# Patient Record
Sex: Female | Born: 1960 | Race: Black or African American | Hispanic: No | Smoking: Current every day smoker
Health system: Southern US, Community
[De-identification: ages and names within clinical notes are randomized; demographics above are authoritative.]

## PROBLEM LIST (undated history)

## (undated) DIAGNOSIS — K219 Gastro-esophageal reflux disease without esophagitis: Secondary | ICD-10-CM

## (undated) DIAGNOSIS — R569 Unspecified convulsions: Secondary | ICD-10-CM

## (undated) DIAGNOSIS — E7800 Pure hypercholesterolemia, unspecified: Secondary | ICD-10-CM

## (undated) DIAGNOSIS — I10 Essential (primary) hypertension: Secondary | ICD-10-CM

## (undated) DIAGNOSIS — I639 Cerebral infarction, unspecified: Secondary | ICD-10-CM

## (undated) DIAGNOSIS — J302 Other seasonal allergic rhinitis: Secondary | ICD-10-CM

## (undated) DIAGNOSIS — G629 Polyneuropathy, unspecified: Secondary | ICD-10-CM

## (undated) HISTORY — DX: Other seasonal allergic rhinitis: J30.2

---

## 1998-04-19 ENCOUNTER — Emergency Department (HOSPITAL_COMMUNITY): Admission: EM | Admit: 1998-04-19 | Discharge: 1998-04-19 | Payer: Self-pay | Admitting: Emergency Medicine

## 1998-04-22 ENCOUNTER — Emergency Department (HOSPITAL_COMMUNITY): Admission: EM | Admit: 1998-04-22 | Discharge: 1998-04-22 | Payer: Self-pay | Admitting: Emergency Medicine

## 1999-01-02 ENCOUNTER — Emergency Department (HOSPITAL_COMMUNITY): Admission: EM | Admit: 1999-01-02 | Discharge: 1999-01-02 | Payer: Self-pay | Admitting: Emergency Medicine

## 1999-01-02 ENCOUNTER — Encounter: Payer: Self-pay | Admitting: Emergency Medicine

## 1999-02-19 ENCOUNTER — Ambulatory Visit (HOSPITAL_COMMUNITY): Admission: RE | Admit: 1999-02-19 | Discharge: 1999-02-19 | Payer: Self-pay | Admitting: *Deleted

## 1999-02-19 ENCOUNTER — Encounter: Payer: Self-pay | Admitting: *Deleted

## 1999-04-09 ENCOUNTER — Ambulatory Visit (HOSPITAL_COMMUNITY): Admission: RE | Admit: 1999-04-09 | Discharge: 1999-04-09 | Payer: Self-pay | Admitting: Orthopedic Surgery

## 1999-04-09 ENCOUNTER — Encounter: Payer: Self-pay | Admitting: Orthopedic Surgery

## 1999-07-05 ENCOUNTER — Observation Stay (HOSPITAL_COMMUNITY): Admission: RE | Admit: 1999-07-05 | Discharge: 1999-07-06 | Payer: Self-pay | Admitting: *Deleted

## 1999-07-05 ENCOUNTER — Encounter (INDEPENDENT_AMBULATORY_CARE_PROVIDER_SITE_OTHER): Payer: Self-pay | Admitting: Specialist

## 1999-07-05 ENCOUNTER — Encounter: Payer: Self-pay | Admitting: Anesthesiology

## 1999-07-26 ENCOUNTER — Encounter: Payer: Self-pay | Admitting: Emergency Medicine

## 1999-07-26 ENCOUNTER — Emergency Department (HOSPITAL_COMMUNITY): Admission: EM | Admit: 1999-07-26 | Discharge: 1999-07-26 | Payer: Self-pay | Admitting: Emergency Medicine

## 1999-09-21 ENCOUNTER — Encounter: Payer: Self-pay | Admitting: *Deleted

## 1999-09-21 ENCOUNTER — Inpatient Hospital Stay (HOSPITAL_COMMUNITY): Admission: AD | Admit: 1999-09-21 | Discharge: 1999-09-21 | Payer: Self-pay | Admitting: *Deleted

## 1999-12-17 ENCOUNTER — Other Ambulatory Visit: Admission: RE | Admit: 1999-12-17 | Discharge: 1999-12-17 | Payer: Self-pay | Admitting: *Deleted

## 2000-04-14 ENCOUNTER — Inpatient Hospital Stay (HOSPITAL_COMMUNITY): Admission: AD | Admit: 2000-04-14 | Discharge: 2000-04-14 | Payer: Self-pay | Admitting: Obstetrics and Gynecology

## 2000-06-02 ENCOUNTER — Encounter: Payer: Self-pay | Admitting: Emergency Medicine

## 2000-06-02 ENCOUNTER — Emergency Department (HOSPITAL_COMMUNITY): Admission: EM | Admit: 2000-06-02 | Discharge: 2000-06-02 | Payer: Self-pay | Admitting: Emergency Medicine

## 2000-08-11 ENCOUNTER — Inpatient Hospital Stay (HOSPITAL_COMMUNITY): Admission: AD | Admit: 2000-08-11 | Discharge: 2000-08-11 | Payer: Self-pay | Admitting: Obstetrics and Gynecology

## 2000-09-29 ENCOUNTER — Ambulatory Visit (HOSPITAL_COMMUNITY): Admission: RE | Admit: 2000-09-29 | Discharge: 2000-09-29 | Payer: Self-pay | Admitting: *Deleted

## 2000-09-29 ENCOUNTER — Encounter (INDEPENDENT_AMBULATORY_CARE_PROVIDER_SITE_OTHER): Payer: Self-pay

## 2001-04-30 ENCOUNTER — Encounter: Payer: Self-pay | Admitting: *Deleted

## 2001-04-30 ENCOUNTER — Encounter: Admission: RE | Admit: 2001-04-30 | Discharge: 2001-04-30 | Payer: Self-pay | Admitting: *Deleted

## 2001-04-30 ENCOUNTER — Inpatient Hospital Stay (HOSPITAL_COMMUNITY): Admission: AD | Admit: 2001-04-30 | Discharge: 2001-04-30 | Payer: Self-pay | Admitting: Obstetrics & Gynecology

## 2001-08-07 ENCOUNTER — Inpatient Hospital Stay (HOSPITAL_COMMUNITY): Admission: AD | Admit: 2001-08-07 | Discharge: 2001-08-07 | Payer: Self-pay | Admitting: *Deleted

## 2001-09-29 ENCOUNTER — Ambulatory Visit (HOSPITAL_COMMUNITY): Admission: RE | Admit: 2001-09-29 | Discharge: 2001-09-29 | Payer: Self-pay | Admitting: Internal Medicine

## 2003-03-23 ENCOUNTER — Encounter: Payer: Self-pay | Admitting: Emergency Medicine

## 2003-03-23 ENCOUNTER — Emergency Department (HOSPITAL_COMMUNITY): Admission: EM | Admit: 2003-03-23 | Discharge: 2003-03-23 | Payer: Self-pay | Admitting: Emergency Medicine

## 2003-08-31 ENCOUNTER — Emergency Department (HOSPITAL_COMMUNITY): Admission: EM | Admit: 2003-08-31 | Discharge: 2003-08-31 | Payer: Self-pay | Admitting: Emergency Medicine

## 2003-08-31 ENCOUNTER — Encounter: Payer: Self-pay | Admitting: Emergency Medicine

## 2004-01-24 ENCOUNTER — Emergency Department (HOSPITAL_COMMUNITY): Admission: EM | Admit: 2004-01-24 | Discharge: 2004-01-24 | Payer: Self-pay | Admitting: Emergency Medicine

## 2004-01-26 ENCOUNTER — Emergency Department (HOSPITAL_COMMUNITY): Admission: EM | Admit: 2004-01-26 | Discharge: 2004-01-26 | Payer: Self-pay | Admitting: Emergency Medicine

## 2004-09-08 ENCOUNTER — Emergency Department (HOSPITAL_COMMUNITY): Admission: EM | Admit: 2004-09-08 | Discharge: 2004-09-08 | Payer: Self-pay | Admitting: Emergency Medicine

## 2005-01-07 ENCOUNTER — Encounter: Admission: RE | Admit: 2005-01-07 | Discharge: 2005-04-07 | Payer: Self-pay | Admitting: Orthopaedic Surgery

## 2005-07-09 ENCOUNTER — Emergency Department (HOSPITAL_COMMUNITY): Admission: EM | Admit: 2005-07-09 | Discharge: 2005-07-09 | Payer: Self-pay | Admitting: Emergency Medicine

## 2006-03-26 ENCOUNTER — Inpatient Hospital Stay (HOSPITAL_COMMUNITY): Admission: AD | Admit: 2006-03-26 | Discharge: 2006-03-26 | Payer: Self-pay | Admitting: Gynecology

## 2006-09-05 ENCOUNTER — Ambulatory Visit: Payer: Self-pay | Admitting: Family Medicine

## 2006-09-08 ENCOUNTER — Ambulatory Visit: Payer: Self-pay | Admitting: *Deleted

## 2006-09-11 DIAGNOSIS — Z8669 Personal history of other diseases of the nervous system and sense organs: Secondary | ICD-10-CM | POA: Insufficient documentation

## 2006-09-14 ENCOUNTER — Emergency Department (HOSPITAL_COMMUNITY): Admission: EM | Admit: 2006-09-14 | Discharge: 2006-09-14 | Payer: Self-pay | Admitting: Emergency Medicine

## 2006-10-29 ENCOUNTER — Ambulatory Visit: Payer: Self-pay | Admitting: Family Medicine

## 2006-10-31 ENCOUNTER — Encounter (INDEPENDENT_AMBULATORY_CARE_PROVIDER_SITE_OTHER): Payer: Self-pay | Admitting: Internal Medicine

## 2006-10-31 ENCOUNTER — Ambulatory Visit: Payer: Self-pay | Admitting: Internal Medicine

## 2006-10-31 LAB — CONVERTED CEMR LAB: Pap Smear: NORMAL

## 2006-11-13 ENCOUNTER — Ambulatory Visit (HOSPITAL_COMMUNITY): Admission: RE | Admit: 2006-11-13 | Discharge: 2006-11-13 | Payer: Self-pay | Admitting: Family Medicine

## 2006-11-25 ENCOUNTER — Ambulatory Visit: Payer: Self-pay | Admitting: Internal Medicine

## 2006-11-25 DIAGNOSIS — R1084 Generalized abdominal pain: Secondary | ICD-10-CM | POA: Insufficient documentation

## 2006-12-12 ENCOUNTER — Ambulatory Visit: Payer: Self-pay | Admitting: Internal Medicine

## 2006-12-17 ENCOUNTER — Ambulatory Visit (HOSPITAL_COMMUNITY): Admission: RE | Admit: 2006-12-17 | Discharge: 2006-12-17 | Payer: Self-pay | Admitting: Internal Medicine

## 2006-12-23 ENCOUNTER — Ambulatory Visit: Payer: Self-pay | Admitting: Internal Medicine

## 2006-12-30 ENCOUNTER — Emergency Department (HOSPITAL_COMMUNITY): Admission: EM | Admit: 2006-12-30 | Discharge: 2006-12-31 | Payer: Self-pay | Admitting: Emergency Medicine

## 2007-01-02 ENCOUNTER — Ambulatory Visit: Payer: Self-pay | Admitting: Internal Medicine

## 2007-01-11 ENCOUNTER — Emergency Department (HOSPITAL_COMMUNITY): Admission: EM | Admit: 2007-01-11 | Discharge: 2007-01-11 | Payer: Self-pay | Admitting: Emergency Medicine

## 2007-05-28 ENCOUNTER — Ambulatory Visit: Payer: Self-pay | Admitting: Internal Medicine

## 2007-05-28 DIAGNOSIS — R030 Elevated blood-pressure reading, without diagnosis of hypertension: Secondary | ICD-10-CM | POA: Insufficient documentation

## 2007-07-22 ENCOUNTER — Telehealth (INDEPENDENT_AMBULATORY_CARE_PROVIDER_SITE_OTHER): Payer: Self-pay | Admitting: Internal Medicine

## 2007-07-23 ENCOUNTER — Encounter (INDEPENDENT_AMBULATORY_CARE_PROVIDER_SITE_OTHER): Payer: Self-pay | Admitting: Internal Medicine

## 2007-07-23 DIAGNOSIS — K219 Gastro-esophageal reflux disease without esophagitis: Secondary | ICD-10-CM | POA: Insufficient documentation

## 2007-07-23 LAB — CONVERTED CEMR LAB: Creatinine, Ser: 0.69 mg/dL

## 2007-07-24 ENCOUNTER — Ambulatory Visit (HOSPITAL_COMMUNITY): Admission: RE | Admit: 2007-07-24 | Discharge: 2007-07-24 | Payer: Self-pay | Admitting: Internal Medicine

## 2007-07-24 ENCOUNTER — Ambulatory Visit: Payer: Self-pay | Admitting: Internal Medicine

## 2007-07-24 DIAGNOSIS — M67919 Unspecified disorder of synovium and tendon, unspecified shoulder: Secondary | ICD-10-CM | POA: Insufficient documentation

## 2007-07-24 DIAGNOSIS — M719 Bursopathy, unspecified: Secondary | ICD-10-CM

## 2007-07-26 ENCOUNTER — Emergency Department (HOSPITAL_COMMUNITY): Admission: EM | Admit: 2007-07-26 | Discharge: 2007-07-26 | Payer: Self-pay | Admitting: Emergency Medicine

## 2007-07-29 ENCOUNTER — Encounter (INDEPENDENT_AMBULATORY_CARE_PROVIDER_SITE_OTHER): Payer: Self-pay | Admitting: *Deleted

## 2007-08-07 ENCOUNTER — Telehealth (INDEPENDENT_AMBULATORY_CARE_PROVIDER_SITE_OTHER): Payer: Self-pay | Admitting: *Deleted

## 2007-10-01 ENCOUNTER — Telehealth (INDEPENDENT_AMBULATORY_CARE_PROVIDER_SITE_OTHER): Payer: Self-pay | Admitting: Internal Medicine

## 2007-10-22 ENCOUNTER — Ambulatory Visit: Payer: Self-pay | Admitting: Internal Medicine

## 2007-10-22 LAB — CONVERTED CEMR LAB
Ketones, urine, test strip: NEGATIVE
Nitrite: POSITIVE
WBC Urine, dipstick: NEGATIVE

## 2007-10-23 ENCOUNTER — Encounter (INDEPENDENT_AMBULATORY_CARE_PROVIDER_SITE_OTHER): Payer: Self-pay | Admitting: Internal Medicine

## 2007-10-26 ENCOUNTER — Telehealth (INDEPENDENT_AMBULATORY_CARE_PROVIDER_SITE_OTHER): Payer: Self-pay | Admitting: Internal Medicine

## 2007-11-24 ENCOUNTER — Encounter: Admission: RE | Admit: 2007-11-24 | Discharge: 2008-01-14 | Payer: Self-pay | Admitting: Internal Medicine

## 2007-11-24 ENCOUNTER — Encounter (INDEPENDENT_AMBULATORY_CARE_PROVIDER_SITE_OTHER): Payer: Self-pay | Admitting: Internal Medicine

## 2007-11-26 ENCOUNTER — Ambulatory Visit: Payer: Self-pay | Admitting: Internal Medicine

## 2007-11-26 DIAGNOSIS — N390 Urinary tract infection, site not specified: Secondary | ICD-10-CM | POA: Insufficient documentation

## 2007-11-26 LAB — CONVERTED CEMR LAB
Ketones, urine, test strip: NEGATIVE
Nitrite: POSITIVE
Specific Gravity, Urine: >=1.03

## 2007-11-28 ENCOUNTER — Encounter (INDEPENDENT_AMBULATORY_CARE_PROVIDER_SITE_OTHER): Payer: Self-pay | Admitting: Internal Medicine

## 2007-12-04 ENCOUNTER — Ambulatory Visit: Payer: Self-pay | Admitting: Nurse Practitioner

## 2007-12-04 LAB — CONVERTED CEMR LAB
Bilirubin Urine: NEGATIVE
Specific Gravity, Urine: >=1.03
Urobilinogen, UA: NEGATIVE
WBC Urine, dipstick: NEGATIVE
pH: 5

## 2007-12-24 ENCOUNTER — Ambulatory Visit: Payer: Self-pay | Admitting: Internal Medicine

## 2007-12-24 DIAGNOSIS — R109 Unspecified abdominal pain: Secondary | ICD-10-CM | POA: Insufficient documentation

## 2007-12-24 DIAGNOSIS — K0501 Acute gingivitis, non-plaque induced: Secondary | ICD-10-CM | POA: Insufficient documentation

## 2007-12-24 LAB — CONVERTED CEMR LAB
Bilirubin Urine: NEGATIVE
Glucose, Urine, Semiquant: NEGATIVE
Ketones, urine, test strip: NEGATIVE
Specific Gravity, Urine: <1.005

## 2007-12-25 ENCOUNTER — Encounter (INDEPENDENT_AMBULATORY_CARE_PROVIDER_SITE_OTHER): Payer: Self-pay | Admitting: Internal Medicine

## 2008-01-21 ENCOUNTER — Encounter (INDEPENDENT_AMBULATORY_CARE_PROVIDER_SITE_OTHER): Payer: Self-pay | Admitting: Internal Medicine

## 2008-02-23 ENCOUNTER — Ambulatory Visit: Payer: Self-pay | Admitting: Internal Medicine

## 2008-02-23 DIAGNOSIS — K089 Disorder of teeth and supporting structures, unspecified: Secondary | ICD-10-CM | POA: Insufficient documentation

## 2008-05-17 ENCOUNTER — Telehealth (INDEPENDENT_AMBULATORY_CARE_PROVIDER_SITE_OTHER): Payer: Self-pay | Admitting: Internal Medicine

## 2008-05-20 ENCOUNTER — Telehealth (INDEPENDENT_AMBULATORY_CARE_PROVIDER_SITE_OTHER): Payer: Self-pay | Admitting: Internal Medicine

## 2008-05-20 ENCOUNTER — Encounter (INDEPENDENT_AMBULATORY_CARE_PROVIDER_SITE_OTHER): Payer: Self-pay | Admitting: Internal Medicine

## 2008-05-31 ENCOUNTER — Ambulatory Visit: Payer: Self-pay | Admitting: Internal Medicine

## 2008-05-31 DIAGNOSIS — F4321 Adjustment disorder with depressed mood: Secondary | ICD-10-CM | POA: Insufficient documentation

## 2008-06-08 ENCOUNTER — Encounter (INDEPENDENT_AMBULATORY_CARE_PROVIDER_SITE_OTHER): Payer: Self-pay | Admitting: Internal Medicine

## 2008-08-09 ENCOUNTER — Telehealth (INDEPENDENT_AMBULATORY_CARE_PROVIDER_SITE_OTHER): Payer: Self-pay | Admitting: Internal Medicine

## 2008-08-25 ENCOUNTER — Encounter (INDEPENDENT_AMBULATORY_CARE_PROVIDER_SITE_OTHER): Payer: Self-pay | Admitting: *Deleted

## 2008-10-19 ENCOUNTER — Telehealth (INDEPENDENT_AMBULATORY_CARE_PROVIDER_SITE_OTHER): Payer: Self-pay | Admitting: Internal Medicine

## 2008-10-21 ENCOUNTER — Ambulatory Visit: Payer: Self-pay | Admitting: Internal Medicine

## 2008-10-21 DIAGNOSIS — B3731 Acute candidiasis of vulva and vagina: Secondary | ICD-10-CM | POA: Insufficient documentation

## 2008-10-21 DIAGNOSIS — B373 Candidiasis of vulva and vagina: Secondary | ICD-10-CM | POA: Insufficient documentation

## 2008-10-21 LAB — CONVERTED CEMR LAB: KOH Prep: NEGATIVE

## 2008-12-08 ENCOUNTER — Ambulatory Visit: Payer: Self-pay | Admitting: Internal Medicine

## 2008-12-08 DIAGNOSIS — F17200 Nicotine dependence, unspecified, uncomplicated: Secondary | ICD-10-CM | POA: Insufficient documentation

## 2008-12-08 LAB — CONVERTED CEMR LAB
Nitrite: POSITIVE
Protein, U semiquant: 30
pH: 5.5

## 2008-12-09 ENCOUNTER — Encounter (INDEPENDENT_AMBULATORY_CARE_PROVIDER_SITE_OTHER): Payer: Self-pay | Admitting: Internal Medicine

## 2009-01-20 ENCOUNTER — Ambulatory Visit: Payer: Self-pay | Admitting: Internal Medicine

## 2009-01-20 DIAGNOSIS — J069 Acute upper respiratory infection, unspecified: Secondary | ICD-10-CM | POA: Insufficient documentation

## 2009-01-20 LAB — CONVERTED CEMR LAB
Ketones, urine, test strip: NEGATIVE
Nitrite: NEGATIVE
Specific Gravity, Urine: 1.015
pH: 8

## 2009-03-14 ENCOUNTER — Encounter (INDEPENDENT_AMBULATORY_CARE_PROVIDER_SITE_OTHER): Payer: Self-pay | Admitting: Internal Medicine

## 2009-03-14 ENCOUNTER — Ambulatory Visit: Payer: Self-pay | Admitting: Internal Medicine

## 2009-03-14 LAB — CONVERTED CEMR LAB
Albumin: 4.2 g/dL (ref 3.5–5.2)
Alkaline Phosphatase: 73 units/L (ref 39–117)
Basophils Relative: 0 % (ref 0–1)
Chlamydia, DNA Probe: NEGATIVE
Chloride: 106 meq/L (ref 96–112)
Cholesterol: 200 mg/dL (ref 0–200)
Eosinophils Relative: 0 % (ref 0–5)
Glucose, Urine, Semiquant: NEGATIVE
HCT: 39.6 % (ref 36.0–46.0)
LDL Cholesterol: 121 mg/dL — ABNORMAL HIGH (ref 0–99)
Lymphs Abs: 3.8 10*3/uL (ref 0.7–4.0)
MCHC: 32.1 g/dL (ref 30.0–36.0)
MCV: 93.2 fL (ref 78.0–100.0)
Monocytes Relative: 10 % (ref 3–12)
Nitrite: NEGATIVE
Platelets: 251 10*3/uL (ref 150–400)
RBC: 4.25 M/uL (ref 3.87–5.11)
RDW: 12.8 % (ref 11.5–15.5)
Specific Gravity, Urine: 1.025
Total Bilirubin: 0.7 mg/dL (ref 0.3–1.2)
Total CHOL/HDL Ratio: 2.9
Total Protein: 7.8 g/dL (ref 6.0–8.3)
Triglycerides: 54 mg/dL (ref <150)
WBC: 8.7 10*3/uL (ref 4.0–10.5)

## 2009-03-20 ENCOUNTER — Encounter (INDEPENDENT_AMBULATORY_CARE_PROVIDER_SITE_OTHER): Payer: Self-pay | Admitting: Internal Medicine

## 2009-04-04 ENCOUNTER — Ambulatory Visit (HOSPITAL_COMMUNITY): Admission: RE | Admit: 2009-04-04 | Discharge: 2009-04-04 | Payer: Self-pay | Admitting: Internal Medicine

## 2009-05-30 ENCOUNTER — Ambulatory Visit: Payer: Self-pay | Admitting: Internal Medicine

## 2011-03-29 NOTE — Op Note (Signed)
[facility]  Patient:    Sheryl Brown, Sheryl Brown                         MRN: [id number] Proc. Date: 09/29/00 Adm. Date:  [id number] Attending:  Pleas Koch                           Operative Report  PREOPERATIVE DIAGNOSIS:  Chronic pelvic pain and right lower quadrant pain.  POSTOPERATIVE DIAGNOSIS:  Possible nodular right uterosacral ligament. Right pelvic sidewall retraction pocket, with omental adhesions to anterior abdominal wall at the umbilical site.  OPERATION PERFORMED:  Laparoscopic lysis of adhesions.  Biopsy of right uterosacral ligament and fulguration of uterosacral ligament.  SURGEON:  Georgina Peer, M.D.  ANESTHESIA:  General.  BLOOD LOSS:  Less than 25 cc.  COMPLICATIONS:  None.  FINDINGS:  Possible implant of the right uterosacral ligament, with a retraction pocket on the right sidewall.  Subumbilical anterior abdominal wall adhesions of omentum.  INDICATIONS:   A 50 year old black female with right lower quadrant pain, starting in late May 2001.  The patient has had a negative ultrasound, did not respond to antibiotics, had a negative GC/Chlamydia cultures since this pain began.  However, does have a positive history of previous infection.  The patient is brought in for diagnostic and possibly therapeutic measures.  Her last menstrual period was September 13, 2000.  DESCRIPTION OF PROCEDURE:  The patient was taken to the operating room, given general anesthetic and placed in the dorsolithotomy position.  Placed in [place] stirrups; the vagina, subumbilical and abdomen was prepped and draped in normal sterile fashion.  The bladder was emptied with a catheter.  A uterine manipulator was placed through the cervix.  A subumbilical and vertical incision was made, then 3 L of carbon dioxide gas was insufflated under low pressures.  A separate trocar sleeve introduced under direct vision; 5 mm suprapubic and 5 mm left lower quadrant  ports were placed.  The above pelvic findings were noted.  There appeared to be no injury from laparoscopic trocar  placement.  There was no evidence of fibroids.  The right ovary, tube and appendix were surgically absent.  There was evidence of clips and staples.  There were no adhesions to these.  There were adhesions to the anterior abdominal wall at the umbilical site.  Using a 5 mm scope these were taken down with the tripolar forceps, and any bleeders on the omentum were cauterized.  The right uterosacral ligament had some reddish implants (possibly endometriosis), and a retraction pocket just lateral to these implants. Biopsy of the ligament, including these red implants, were taken and cauterized with bipolar cautery.  The pelvis was irrigated with a [place] irrigator.  The instruments were removed.  Sponge, needle and instrument counts were correct.  The patient was returned to the recovery area in good condition. DD:  09/29/00 TD:  09/30/00 Job: [id number]

## 2011-11-12 DIAGNOSIS — G039 Meningitis, unspecified: Secondary | ICD-10-CM

## 2011-11-12 HISTORY — DX: Meningitis, unspecified: G03.9

## 2016-12-12 DIAGNOSIS — M545 Low back pain, unspecified: Secondary | ICD-10-CM | POA: Insufficient documentation

## 2016-12-12 DIAGNOSIS — G8929 Other chronic pain: Secondary | ICD-10-CM | POA: Insufficient documentation

## 2016-12-12 DIAGNOSIS — J329 Chronic sinusitis, unspecified: Secondary | ICD-10-CM | POA: Insufficient documentation

## 2016-12-12 DIAGNOSIS — Z87891 Personal history of nicotine dependence: Secondary | ICD-10-CM

## 2017-06-03 DIAGNOSIS — E785 Hyperlipidemia, unspecified: Secondary | ICD-10-CM | POA: Insufficient documentation

## 2018-01-28 DIAGNOSIS — I10 Essential (primary) hypertension: Secondary | ICD-10-CM | POA: Insufficient documentation

## 2018-01-28 DIAGNOSIS — R42 Dizziness and giddiness: Secondary | ICD-10-CM | POA: Insufficient documentation

## 2018-04-07 DIAGNOSIS — M19079 Primary osteoarthritis, unspecified ankle and foot: Secondary | ICD-10-CM | POA: Insufficient documentation

## 2019-01-18 DIAGNOSIS — G459 Transient cerebral ischemic attack, unspecified: Secondary | ICD-10-CM | POA: Insufficient documentation

## 2019-01-18 DIAGNOSIS — R569 Unspecified convulsions: Secondary | ICD-10-CM | POA: Insufficient documentation

## 2019-09-01 DIAGNOSIS — E559 Vitamin D deficiency, unspecified: Secondary | ICD-10-CM | POA: Insufficient documentation

## 2021-02-19 ENCOUNTER — Emergency Department (HOSPITAL_COMMUNITY)
Admission: EM | Admit: 2021-02-19 | Discharge: 2021-02-19 | Disposition: A | Discharge status: Home / Self Care | Payer: 59 | Attending: Emergency Medicine | Admitting: Emergency Medicine

## 2021-02-19 ENCOUNTER — Encounter (HOSPITAL_COMMUNITY): Payer: Self-pay | Admitting: Emergency Medicine

## 2021-02-19 ENCOUNTER — Other Ambulatory Visit: Payer: Self-pay

## 2021-02-19 DIAGNOSIS — K029 Dental caries, unspecified: Secondary | ICD-10-CM | POA: Diagnosis not present

## 2021-02-19 DIAGNOSIS — F17210 Nicotine dependence, cigarettes, uncomplicated: Secondary | ICD-10-CM | POA: Insufficient documentation

## 2021-02-19 DIAGNOSIS — K0889 Other specified disorders of teeth and supporting structures: Secondary | ICD-10-CM | POA: Diagnosis present

## 2021-02-19 HISTORY — DX: Unspecified convulsions: R56.9

## 2021-02-19 HISTORY — DX: Cerebral infarction, unspecified: I63.9

## 2021-02-19 HISTORY — DX: Polyneuropathy, unspecified: G62.9

## 2021-02-19 MED ORDER — OXYCODONE-ACETAMINOPHEN 5-325 MG PO TABS
1.0000 | ORAL_TABLET | Freq: Once | ORAL | Status: AC
  Administered 2021-02-19: 1 via ORAL
  Filled 2021-02-19: qty 1

## 2021-02-19 MED ORDER — OXYCODONE HCL 5 MG PO TABS: 5.0000 mg | ORAL_TABLET | ORAL | 0 refills | Status: DC | PRN

## 2021-02-19 MED ORDER — AMOXICILLIN 500 MG PO CAPS: 500.0000 mg | ORAL_CAPSULE | Freq: Two times a day (BID) | ORAL | 0 refills | Status: AC

## 2021-02-19 NOTE — ED Triage Notes (Signed)
Pt reports broken tooth on L side, causing pain and swelling. Taking ibuprofen without relief.

## 2021-02-19 NOTE — ED Provider Notes (Signed)
[facility] EMERGENCY DEPARTMENT Provider Note   CSN: [id number] Arrival date & time: 02/19/21  1416     History Chief Complaint  Patient presents with  . Dental Pain    Sheryl Brown is a 60 y.o. female with history significant for neuropathy, prior CVA who presents for evaluation of dental pain.  Began as ago.  Has known fractured teeth.  Has denies any respiratory or bleeding.  She feels like she has had some mild swelling to the left side of her face.  She has no chest pain or shortness of breath.  She has no drooling, dysphagia or trismus.  Rates her pain a 9/10.  Pain worse with hot and cold items.  She denies fever, chills, nausea vomiting, chest pain, shortness of abdominal pain, diarrhea, dysuria, paresthesias or weakness.  Has been tolerating p.o. intake at home without difficulty.  History obtained from patient and past medical records.  No interpreter used. HPI     Past Medical History:  Diagnosis Date  . Neuropathy   . Seizures (HCC)   . Stroke [facility])     Patient Active Problem List   Diagnosis Date Noted  . URI 01/20/2009  . TOBACCO ABUSE 12/08/2008  . CANDIDIASIS, VAGINAL 10/21/2008  . DEPRESSION, SITUATIONAL 05/31/2008  . DENTAL PAIN 02/23/2008  . ACUTE GINGIVITIS NONPLAQUE INDUCED 12/24/2007  . INGUINAL PAIN, RIGHT 12/24/2007  . UTI 11/26/2007  . ROTATOR CUFF SYNDROME, RIGHT 07/24/2007  . GERD 07/23/2007  . ELEVATED BLOOD PRESSURE WITHOUT DIAGNOSIS OF HYPERTENSION 05/28/2007  . SYMPTOM, PAIN, ABDOMINAL, GENERALIZED 11/25/2006  . LABYRINTHITIS, HX OF 09/11/2006    History reviewed. No pertinent surgical history.   OB History   No obstetric history on file.     No family history on file.  Social History   Tobacco Use  . Smoking status: Current Every Day Smoker    Packs/day: 0.50    Types: Cigarettes  . Smokeless tobacco: Never Used  Substance Use Topics  . Alcohol use: Yes    Comment: only on holidays  . Drug use: Never     Home Medications Prior to Admission medications   Medication Sig Start Date End Date Taking? Authorizing Provider  amoxicillin (AMOXIL) 500 MG capsule Take 1 capsule (500 mg total) by mouth 2 (two) times daily for 5 days. 02/19/21 02/24/21 Yes Rateel Beldin A, PA-C  oxyCODONE (ROXICODONE) 5 MG immediate release tablet Take 1 tablet (5 mg total) by mouth every 4 (four) hours as needed for severe pain. 02/19/21  Yes Katheleen Stella A, PA-C    Allergies    Patient has no known allergies.  Review of Systems   Review of Systems  Constitutional: Negative.   HENT: Positive for dental problem and facial swelling. Negative for congestion, drooling, ear discharge, ear pain, hearing loss, mouth sores, nosebleeds, postnasal drip, rhinorrhea, sinus pressure, sinus pain, sneezing, sore throat, trouble swallowing and voice change.   Respiratory: Negative.   Cardiovascular: Negative.   Gastrointestinal: Negative.   Genitourinary: Negative.   Musculoskeletal: Negative.   Skin: Negative.   Neurological: Negative.   All other systems reviewed and are negative.   Physical Exam Updated Vital Signs BP (!) 160/111   Pulse 79   Temp 98.6 F (37 C)   Resp 17   SpO2 100%   Physical Exam Vitals and nursing note reviewed.  Constitutional:      General: She is not in acute distress.    Appearance: She is well-developed. She is not  ill-appearing, toxic-appearing or diaphoretic.  HENT:     Head: Normocephalic and atraumatic.     Jaw: There is normal jaw occlusion.     Comments: No dysphagia or trismus.  Sublingual or submandibular induration.  No evidence of Ludwig's induration    Nose: Nose normal.     Mouth/Throat:     Lips: Pink.     Mouth: Mucous membranes are moist.     Dentition: Abnormal dentition. Dental tenderness and dental caries present. No gingival swelling or dental abscesses.     Pharynx: Oropharynx is clear. Uvula midline.     Tonsils: No tonsillar exudate or tonsillar  abscesses.     Comments: Posterior oropharynx clear.  Mucous membranes moist.  Fractured tooth to left lower dentition.  She has been gingival erythema however no gross abscess.  Sublingual area soft.  No evidence of PTA or RPA.  She is tolerating p.o. intake without difficulty Eyes:     Pupils: Pupils are equal, round, and reactive to light.  Neck:     Trachea: Trachea and phonation normal.     Comments: Range of motion without difficulty Cardiovascular:     Rate and Rhythm: Normal rate.     Pulses: Normal pulses.          Radial pulses are 2+ on the right side and 2+ on the left side.     Heart sounds: Normal heart sounds.  Pulmonary:     Effort: Pulmonary effort is normal. No respiratory distress.     Breath sounds: Normal breath sounds and air entry.     Comments: Equal rise and fall to chest Abdominal:     General: There is no distension.  Musculoskeletal:        General: Normal range of motion.     Cervical back: Full passive range of motion without pain and normal range of motion.  Lymphadenopathy:     Cervical: No cervical adenopathy.  Skin:    General: Skin is warm and dry.     Capillary Refill: Capillary refill takes less than 2 seconds.     Comments: Very minimal facial swelling.  No fluctuance or induration.  Neurological:     General: No focal deficit present.     Mental Status: She is alert.     Cranial Nerves: Cranial nerves are intact.     Gait: Gait is intact.     Comments: Urinary schedule grossly intact Ambulatory without difficulty     ED Results / Procedures / Treatments   Labs (all labs ordered are listed, but only abnormal results are displayed) Labs Reviewed - No data to display  EKG None  Radiology No results found.  Procedures Procedures   Medications Ordered in ED Medications  oxyCODONE-acetaminophen (PERCOCET/ROXICET) 5-325 MG per tablet 1 tablet (has no administration in time range)    ED Course  I have reviewed the triage vital  signs and the nursing notes.  Pertinent labs & imaging results that were available during my care of the patient were reviewed by me and considered in my medical decision making (see chart for details).   Patient here for evaluation of dental pain.  She is afebrile, nonseptic, non-ill-appearing.  Began 3 days ago.  Has some very minimal left-sided facial swelling however no evidence of gross abscess.  No sublingual or submandibular induration.  She has no associated chest pain, shortness of breath low suspicion for atypical ACS.  Has multiple missing teeth.  Has some gingival erythema.  She has  a nonfocal neuro exam without deficits.  tolerating p.o. intake without difficulty.   Exam unconcerning for Ludwig's angina or spread of infection.  Will treat with penicillin and anti-inflammatories medicine.    The patient has been appropriately medically screened and/or stabilized in the ED. I have low suspicion for any other emergent medical condition which would require further screening, evaluation or treatment in the ED or require inpatient management.  Patient is hemodynamically stable and in no acute distress.  Patient able to ambulate in department prior to ED.  Evaluation does not show acute pathology that would require ongoing or additional emergent interventions while in the emergency department or further inpatient treatment.  I have discussed the diagnosis with the patient and answered all questions.  Pain is been managed while in the emergency department and patient has no further complaints prior to discharge.  Patient is comfortable with plan discussed in room and is stable for discharge at this time.  I have discussed strict return precautions for returning to the emergency department.  Patient was encouraged to follow-up with PCP/specialist refer to at discharge.   MDM Rules/Calculators/A&P                           Final Clinical Impression(s) / ED Diagnoses Final diagnoses:  Pain,  dental    Rx / DC Orders ED Discharge Orders         Ordered    amoxicillin (AMOXIL) 500 MG capsule  2 times daily        02/19/21 1559    oxyCODONE (ROXICODONE) 5 MG immediate release tablet  Every 4 hours PRN        02/19/21 1559           Madina Galati A, PA-C 02/19/21 1606    Benjiman Core, MD 02/21/21 [phone]

## 2021-02-19 NOTE — ED Triage Notes (Signed)
Emergency Medicine Provider Triage Evaluation Note  Sheryl Brown , a 60 y.o. female  was evaluated in triage.  Pt complains of facial swelling and dental pain.  Began a few days ago.  Has known fractured teeth.  She has a Education officer, community in [place] however not locally.  She feels like she has had some swelling to the left side of her face.  She has no drooling, dysphagia or trismus.  Review of Systems  Positive: Left dental pain Negative: Drooling, dysphagia, trismus.  No sublingual induration, submandibular induration  Physical Exam  There were no vitals taken for this visit. Gen:   Awake, no distress   HEENT:  Atraumatic, fractured tooth to left lower dentition.  Some gingival erythema to left upper dentition, minimal left facial swelling however no sublingual or submandibular induration Resp:  Normal effort  Cardiac:  Normal rate  Abd:   Nondistended, nontender  MSK:   Moves extremities without difficulty Neuro:  Speech clear   Medical Decision Making  Medically screening exam initiated at 2:20 PM.  Appropriate orders placed.  Sheryl Brown was informed that the remainder of the evaluation will be completed by another provider, this initial triage assessment does not replace that evaluation, and the importance of remaining in the ED until their evaluation is complete.  Clinical Impression  Dental pain, facial swelling   Mohit Zirbes A, PA-C 02/19/21 1426

## 2021-05-24 ENCOUNTER — Emergency Department (HOSPITAL_COMMUNITY): Payer: 59

## 2021-05-24 ENCOUNTER — Emergency Department (HOSPITAL_COMMUNITY)
Admission: EM | Admit: 2021-05-24 | Discharge: 2021-05-24 | Disposition: A | Discharge status: Home / Self Care | Payer: 59 | Attending: Emergency Medicine | Admitting: Emergency Medicine

## 2021-05-24 ENCOUNTER — Encounter (HOSPITAL_COMMUNITY): Payer: Self-pay

## 2021-05-24 ENCOUNTER — Other Ambulatory Visit: Payer: Self-pay

## 2021-05-24 DIAGNOSIS — R202 Paresthesia of skin: Secondary | ICD-10-CM | POA: Diagnosis present

## 2021-05-24 DIAGNOSIS — I10 Essential (primary) hypertension: Secondary | ICD-10-CM | POA: Insufficient documentation

## 2021-05-24 DIAGNOSIS — R200 Anesthesia of skin: Secondary | ICD-10-CM

## 2021-05-24 DIAGNOSIS — H538 Other visual disturbances: Secondary | ICD-10-CM | POA: Insufficient documentation

## 2021-05-24 DIAGNOSIS — Z8673 Personal history of transient ischemic attack (TIA), and cerebral infarction without residual deficits: Secondary | ICD-10-CM | POA: Diagnosis not present

## 2021-05-24 LAB — COMPREHENSIVE METABOLIC PANEL
ALT: 32 U/L (ref 0–44)
AST: 27 U/L (ref 15–41)
Albumin: 4.5 g/dL (ref 3.5–5.0)
Alkaline Phosphatase: 68 U/L (ref 38–126)
Anion gap: 9 (ref 5–15)
BUN: 22 mg/dL — ABNORMAL HIGH (ref 6–20)
CO2: 20 mmol/L — ABNORMAL LOW (ref 22–32)
Calcium: 9.6 mg/dL (ref 8.9–10.3)
Chloride: 106 mmol/L (ref 98–111)
Creatinine, Ser: 0.88 mg/dL (ref 0.44–1.00)
GFR, Estimated: >60 mL/min (ref >60)
Glucose, Bld: 93 mg/dL (ref 70–99)
Potassium: 5.2 mmol/L — ABNORMAL HIGH (ref 3.5–5.1)
Sodium: 135 mmol/L (ref 135–145)
Total Bilirubin: 0.9 mg/dL (ref 0.3–1.2)
Total Protein: 8.7 g/dL — ABNORMAL HIGH (ref 6.5–8.1)

## 2021-05-24 LAB — CBC WITH DIFFERENTIAL/PLATELET
Abs Immature Granulocytes: 0.01 10*3/uL (ref 0.00–0.07)
Basophils Absolute: 0 10*3/uL (ref 0.0–0.1)
Basophils Relative: 1 %
Eosinophils Absolute: 0.1 10*3/uL (ref 0.0–0.5)
Eosinophils Relative: 1 %
HCT: 43.8 % (ref 36.0–46.0)
Hemoglobin: 14 g/dL (ref 12.0–15.0)
Immature Granulocytes: 0 %
Lymphocytes Relative: 41 %
Lymphs Abs: 3.6 10*3/uL (ref 0.7–4.0)
MCH: 29.6 pg (ref 26.0–34.0)
MCHC: 32 g/dL (ref 30.0–36.0)
MCV: 92.6 fL (ref 80.0–100.0)
Monocytes Absolute: 0.6 10*3/uL (ref 0.1–1.0)
Monocytes Relative: 7 %
Neutro Abs: 4.5 10*3/uL (ref 1.7–7.7)
Neutrophils Relative %: 50 %
Platelets: 255 10*3/uL (ref 150–400)
RBC: 4.73 MIL/uL (ref 3.87–5.11)
RDW: 13 % (ref 11.5–15.5)
WBC: 8.9 10*3/uL (ref 4.0–10.5)
nRBC: 0 % (ref 0.0–0.2)

## 2021-05-24 LAB — LIPASE, BLOOD: Lipase: 37 U/L (ref 11–51)

## 2021-05-24 MED ORDER — GABAPENTIN 400 MG PO CAPS: 400.0000 mg | ORAL_CAPSULE | Freq: Four times a day (QID) | ORAL | 0 refills | Status: DC

## 2021-05-24 MED ORDER — MECLIZINE HCL 25 MG PO TABS
25.0000 mg | ORAL_TABLET | Freq: Once | ORAL | Status: AC
  Administered 2021-05-24: 25 mg via ORAL
  Filled 2021-05-24: qty 1

## 2021-05-24 NOTE — ED Provider Notes (Signed)
[facility]   CSN: [id number] Arrival date & time: 05/24/21  1225     History Chief Complaint  Patient presents with   Arm Pain   face numbness   Bloated   Dizziness    Sheryl Brown is a 60 y.o. female.  HPI Patient with multiple medical issues who recently moved here from [facility], has no medical records with her, nor medications, presents with new left facial numbness and bilateral vision haziness. Onset was today, this morning, unclear time, but within the past 12 hours.  Since onset symptoms been persistent.  She notes a history of prior stroke, seizure, and meningitis. She has no fever, no neck pain, no weakness in her extremities. Since onset no clear alleviating or exacerbating factors.  Patient notes multiple other medical issues, but changes today was as above, numbness and vision changes.    Past Medical History:  Diagnosis Date   Meningitis 2013   Neuropathy    Seizures (HCC)    Stroke [facility])     Patient Active Problem List   Diagnosis Date Noted   URI 01/20/2009   TOBACCO ABUSE 12/08/2008   CANDIDIASIS, VAGINAL 10/21/2008   DEPRESSION, SITUATIONAL 05/31/2008   DENTAL PAIN 02/23/2008   ACUTE GINGIVITIS NONPLAQUE INDUCED 12/24/2007   INGUINAL PAIN, RIGHT 12/24/2007   UTI 11/26/2007   ROTATOR CUFF SYNDROME, RIGHT 07/24/2007   GERD 07/23/2007   ELEVATED BLOOD PRESSURE WITHOUT DIAGNOSIS OF HYPERTENSION 05/28/2007   SYMPTOM, PAIN, ABDOMINAL, GENERALIZED 11/25/2006   LABYRINTHITIS, HX OF 09/11/2006    History reviewed. No pertinent surgical history.   OB History   No obstetric history on file.     Family History  Problem Relation Age of Onset   Heart failure Mother     Social History   Tobacco Use   Smoking status: Every Day    Packs/day: 0.50    Types: Cigarettes   Smokeless tobacco: Never  Vaping Use   Vaping Use: Never used  Substance Use Topics   Alcohol use: Yes    Comment: only on  holidays   Drug use: Never    Home Medications Prior to Admission medications   Medication Sig Start Date End Date Taking? Authorizing Provider  oxyCODONE (ROXICODONE) 5 MG immediate release tablet Take 1 tablet (5 mg total) by mouth every 4 (four) hours as needed for severe pain. 02/19/21   Henderly, Britni A, PA-C    Allergies    Patient has no known allergies.  Review of Systems   Review of Systems  Constitutional:        Per HPI, otherwise negative  HENT:         Per HPI, otherwise negative  Eyes:  Positive for visual disturbance.  Respiratory:         Per HPI, otherwise negative  Cardiovascular:        Per HPI, otherwise negative  Gastrointestinal:  Negative for vomiting.  Endocrine:       Negative aside from HPI  Genitourinary:        Neg aside from HPI   Musculoskeletal:        Chronic right shoulder pain  Skin: Negative.   Neurological:  Positive for numbness. Negative for syncope and weakness.   Physical Exam Updated Vital Signs BP (!) 136/98   Pulse 89   Temp 98.8 F (37.1 C) (Oral)   Resp 20   Ht 5\' 1"  (1.549 m)   Wt 76.8 kg   SpO2  100%   BMI 32.01 kg/m   Physical Exam Vitals and nursing note reviewed.  Constitutional:      General: She is not in acute distress.    Appearance: She is well-developed.  HENT:     Head: Normocephalic and atraumatic.  Eyes:     Conjunctiva/sclera: Conjunctivae normal.  Cardiovascular:     Rate and Rhythm: Normal rate and regular rhythm.  Pulmonary:     Effort: Pulmonary effort is normal. No respiratory distress.     Breath sounds: Normal breath sounds. No stridor.  Abdominal:     General: There is no distension.  Skin:    General: Skin is warm and dry.  Neurological:     Mental Status: She is alert and oriented to person, place, and time.     Cranial Nerves: Cranial nerves are intact. No cranial nerve deficit, dysarthria or facial asymmetry.     Motor: No weakness, tremor, atrophy or abnormal muscle tone.     ED Results / Procedures / Treatments   Labs (all labs ordered are listed, but only abnormal results are displayed) Labs Reviewed  COMPREHENSIVE METABOLIC PANEL - Abnormal; Notable for the following components:      Result Value   Potassium 5.2 (*)    CO2 20 (*)    BUN 22 (*)    Total Protein 8.7 (*)    All other components within normal limits  CBC WITH DIFFERENTIAL/PLATELET  LIPASE, BLOOD  URINALYSIS, ROUTINE W REFLEX MICROSCOPIC    EKG EKG Interpretation  Date/Time:  Thursday May 24 2021 15:00:41 EDT Ventricular Rate:  80 PR Interval:  124 QRS Duration: 72 QT Interval:  356 QTC Calculation: 410 R Axis:   52 Text Interpretation: grossly normal Normal sinus rhythm Confirmed by Gerhard Munch [id number]) on 05/24/2021 3:09:37 PM  Radiology DG Shoulder Right  Result Date: 05/24/2021 CLINICAL DATA:  Arm pain.  No trauma history submitted. EXAM: RIGHT SHOULDER - 2+ VIEW COMPARISON:  07/24/2007 FINDINGS: No acute fracture or dislocation. Visualized portion of the right hemithorax is normal. IMPRESSION: No acute osseous abnormality. Electronically Signed   By: Jeronimo Greaves M.D.   On: 05/24/2021 14:16    Procedures Procedures   Medications Ordered in ED Medications - No data to display  ED Course  I have reviewed the triage vital signs and the nursing notes.  Pertinent labs & imaging results that were available during my care of the patient were reviewed by me and considered in my medical decision making (see chart for details).   3:14 PM Patient in no distress.  Visual acuity 20/70 bilaterally, unclear baseline, wears glasses for reading.  Otherwise visual exam was unremarkable.  Initial findings including vital signs, x-ray, EKG all reassuring, MRI pending. MDM Rules/Calculators/A&P Adult female with multiple medical issues including reported history of prior stroke presents with new facial numbness, reported visual blurriness.  Patient is otherwise neurologically  unremarkable, objectively has findings notable only for diminished visual acuity, though this may be baseline.  Given her description of new numbness, subjective, and history of stroke MRI is pending.  Labs pending as well.  No early evidence for meningitis, and given the absence of nuchal rigidity, distress, fever, this is low suspicion.  Patient's ongoing symptoms suggest Seizure etiology.  On sign-out studies pending.  Final Clinical Impression(s) / ED Diagnoses Final diagnoses:  Numbness  Blurry vision     Gerhard Munch, MD 05/24/21 1517

## 2021-05-24 NOTE — ED Provider Notes (Addendum)
  Physical Exam  BP (!) 136/98   Pulse 89   Temp 98.8 F (37.1 C) (Oral)   Resp 20   Ht 5\' 1"  (1.549 m)   Wt 76.8 kg   SpO2 100%   BMI 32.01 kg/m   Physical Exam  ED Course/Procedures     Procedures  MDM  presented with arm pain and some facial numbness.  ED work-up was within normal limits including an MRI.  She will be discharged home with recommendations for neurologic follow-up.  I was able to import the patient's medication from her records in [name].  She needed a refill on her gabapentin, and this was placed.  She does already have a primary care provider appointment, and her PCP was updated in her chart.  It sounds like she may have vertigo as a source of her dizziness.  She has had this in the past, and she was given meclizine here.  If she has some improvement in her symptoms, she can buy this over-the-counter.  She may have rotator cuff syndrome as the etiology of her shoulder pain, but she declined a cortisone injection.  She was given a referral to Ortho.       [place], MD 05/24/21 1642    05/26/21, MD 05/24/21 [phone]

## 2021-05-24 NOTE — ED Provider Notes (Signed)
Emergency Medicine Provider Triage Evaluation Note  Sheryl Brown , a 60 y.o. female  was evaluated in triage.  Pt complains of blurred vision bilaterally, numbness to the left side of her face, right arm pain, generalized abdominal pain.  Patient reports that her symptoms began when she woke today at 10 AM.  Patient denies any recent falls or injuries.  Denies any contact use.  Review of Systems  Positive: Blurry vision, right shoulder pain, generalized abdominal pain, numbness to left side of face, Negative: Fever, chills, vision loss, eye discharge, neck pain, back pain  Physical Exam  BP (!) 136/95 (BP Location: Right Arm)   Pulse 94   Temp 98.8 F (37.1 C) (Oral)   Resp 18   Ht 5\' 1"  (1.549 m)   Wt 76.8 kg   SpO2 99%   BMI 32.01 kg/m  Gen:   Awake, no distress   Resp:  Normal effort  MSK:   Difficulty moving right arm due to complaints of right shoulder pain, grip strength equal.  Tenderness to anterior right shoulder, no deformity or swelling noted. Other:  EOM intact bilaterally, pupils PERRL.  No facial asymmetry or slurred speech.  Sensation to light touch intact to bilateral upper and lower extremities.    Medical Decision Making  Medically screening exam initiated at 12:49 PM.  Appropriate orders placed.  Sheryl Brown was informed that the remainder of the evaluation will be completed by another provider, this initial triage assessment does not replace that evaluation, and the importance of remaining in the ED until their evaluation is complete.  The patient appears stable so that the remainder of the work up may be completed by another provider.      Sheryl Brown 05/24/21 1252    05/26/21, MD 05/27/21 2206

## 2021-05-24 NOTE — ED Triage Notes (Signed)
Patient states she woke this AM  around 1000 and had numbness of the left face and blurred vision in both eyes. Patient has a history of a stroke. Patient c/o right shoulder pain and states she can't move because of pain in the right shoulder.  Patient c/o abdominal pain and bloating since waking this AM.

## 2021-05-24 NOTE — Discharge Instructions (Addendum)
As discussed, your evaluation today has been largely reassuring.  But, it is important that you monitor your condition carefully, and do not hesitate to return to the ED if you develop new, or concerning changes in your condition.  Otherwise, please follow-up with your physician for appropriate ongoing care.  You can use Meclizine for dizziness.

## 2021-05-24 NOTE — ED Notes (Addendum)
Pt stated that she wears glasses but only for reading. Pt did not have glasses with her.

## 2021-06-11 ENCOUNTER — Ambulatory Visit (INDEPENDENT_AMBULATORY_CARE_PROVIDER_SITE_OTHER): Payer: 59 | Admitting: Primary Care

## 2021-06-11 ENCOUNTER — Other Ambulatory Visit: Payer: Self-pay

## 2021-06-11 ENCOUNTER — Encounter (INDEPENDENT_AMBULATORY_CARE_PROVIDER_SITE_OTHER): Payer: Self-pay | Admitting: Primary Care

## 2021-06-11 ENCOUNTER — Other Ambulatory Visit (INDEPENDENT_AMBULATORY_CARE_PROVIDER_SITE_OTHER): Payer: Self-pay | Admitting: Primary Care

## 2021-06-11 VITALS — BP 130/85 | HR 81 | Resp 16 | Ht 60.0 in | Wt 172.6 lb

## 2021-06-11 DIAGNOSIS — Z6833 Body mass index (BMI) 33.0-33.9, adult: Secondary | ICD-10-CM

## 2021-06-11 DIAGNOSIS — Z760 Encounter for issue of repeat prescription: Secondary | ICD-10-CM

## 2021-06-11 DIAGNOSIS — M79671 Pain in right foot: Secondary | ICD-10-CM

## 2021-06-11 DIAGNOSIS — R569 Unspecified convulsions: Secondary | ICD-10-CM

## 2021-06-11 DIAGNOSIS — R202 Paresthesia of skin: Secondary | ICD-10-CM

## 2021-06-11 DIAGNOSIS — F17210 Nicotine dependence, cigarettes, uncomplicated: Secondary | ICD-10-CM | POA: Diagnosis not present

## 2021-06-11 DIAGNOSIS — F17200 Nicotine dependence, unspecified, uncomplicated: Secondary | ICD-10-CM

## 2021-06-11 DIAGNOSIS — I10 Essential (primary) hypertension: Secondary | ICD-10-CM

## 2021-06-11 DIAGNOSIS — E782 Mixed hyperlipidemia: Secondary | ICD-10-CM

## 2021-06-11 DIAGNOSIS — E6609 Other obesity due to excess calories: Secondary | ICD-10-CM

## 2021-06-11 DIAGNOSIS — Z7689 Persons encountering health services in other specified circumstances: Secondary | ICD-10-CM

## 2021-06-11 MED ORDER — AMLODIPINE BESYLATE 5 MG PO TABS: 5.0000 mg | ORAL_TABLET | Freq: Every day | ORAL | 1 refills | Status: DC

## 2021-06-11 MED ORDER — ATORVASTATIN CALCIUM 40 MG PO TABS: 40.0000 mg | ORAL_TABLET | Freq: Every day | ORAL | 0 refills | Status: DC

## 2021-06-11 MED ORDER — GABAPENTIN 400 MG PO CAPS: 400.0000 mg | ORAL_CAPSULE | Freq: Three times a day (TID) | ORAL | 1 refills | Status: DC

## 2021-06-11 MED ORDER — VALSARTAN 160 MG PO TABS: 160.0000 mg | ORAL_TABLET | Freq: Every day | ORAL | 1 refills | Status: DC

## 2021-06-11 NOTE — Telephone Encounter (Unsigned)
Medication: lamoTRIgine (LAMICTAL) 100 MG tablet [[id number]]   Has the patient contacted their pharmacy? YES  (Agent: If no, request that the patient contact the pharmacy for the refill.) (Agent: If yes, when and what did the pharmacy advise?)  Preferred Pharmacy (with phone number or street name): [facility] [phone] - [place], [place] - [address] AT [facility] [address] & [address] [address] Phone: [phone] Fax: [phone] Hours: Not open 24 hours    Agent: Please be advised that RX refills may take up to 3 business days. We ask that you follow-up with your pharmacy.

## 2021-06-11 NOTE — Progress Notes (Signed)
New Patient Office Visit  Subjective:  Patient ID: Sheryl Brown, female    DOB: [birth year]  Age: 60 y.o. MRN: [id number]  CC:  Chief Complaint  Patient presents with   New Patient (Initial Visit)    HPI Ms. Sheryl Brown is a 60 year old obese female who presents for establishment of care and her may concern is right foot pain with a cyst on top of foot and numbness and tingling. Pain scale 9/10.  Past Medical History:  Diagnosis Date   Meningitis 2013   Neuropathy    Seizures (HCC)    Stroke (HCC)     No past surgical history on file.  Family History  Problem Relation Age of Onset   Heart failure Mother     Social History   Socioeconomic History   Marital status: Legally Separated    Spouse name: Not on file   Number of children: Not on file   Years of education: Not on file   Highest education level: Not on file  Occupational History   Not on file  Tobacco Use   Smoking status: Every Day    Packs/day: 0.50    Types: Cigarettes   Smokeless tobacco: Never  Vaping Use   Vaping Use: Never used  Substance and Sexual Activity   Alcohol use: Yes    Comment: only on holidays   Drug use: Never   Sexual activity: Never  Other Topics Concern   Not on file  Social History Narrative   Not on file   Social Determinants of Health   Financial Resource Strain: Not on file  Food Insecurity: Not on file  Transportation Needs: Not on file  Physical Activity: Not on file  Stress: Not on file  Social Connections: Not on file  Intimate Partner Violence: Not on file    ROS Review of Systems  Neurological:  Positive for dizziness.  All other systems reviewed and are negative.  Objective:   Today's Vitals: BP 130/85   Pulse 81   Resp 16   Ht 5' (1.524 m)   Wt 172 lb 9.6 oz (78.3 kg)   SpO2 95%   BMI 33.71 kg/m   Physical Exam General: Vital signs reviewed.  Patient is well-developed and well-nourished, obese female in no acute distress and cooperative  with exam.  Head: Normocephalic and atraumatic. Eyes: EOMI, conjunctivae normal, no scleral icterus.  Neck: Supple, trachea midline, normal ROM, no JVD, masses, thyromegaly, or carotid bruit present.  Cardiovascular: RRR, S1 normal, S2 normal, no murmurs, gallops, or rubs. Pulmonary/Chest: Clear to auscultation bilaterally, no wheezes, rales, or rhonchi. Abdominal: Soft, non-tender, non-distended, BS +, no masses, organomegaly, or guarding present.  Musculoskeletal: No joint deformities, erythema, or stiffness, ROM full and nontender. Extremities: No lower extremity edema bilaterally,  pulses symmetric and intact bilaterally. No cyanosis or clubbing. Neurological: A&O x3, Strength is normal and symmetric bilaterally Skin: Warm, dry and intact. No rashes or erythema. Psychiatric: Normal mood and affect. speech and behavior is normal. Cognition and memory are normal.   Assessment & Plan:  Sheryl Brown was seen today for new patient (initial visit).  Diagnoses and all orders for this visit:  Encounter to establish care Establish care with PCP  Mixed hyperlipidemia Explained Over time and in combination with inflammation and other factors, this contributes to plaque which in turn may lead to stroke and/or heart attack down the road.  To reduce your cholesterol Remember - more fruits and vegetables, more fish, and  limit red meat and dairy products.  More soy, nuts, beans, barley, lentils, oats and plant sterol ester enriched margarine instead of butter.  I also encourage eliminating sugar and processed food.  Remember, shop on the outside of the grocery store and visit your [employer].   If you would like to talk with me about dietary changes plus or minus medications for your cholesterol, please let me know. We should recheck your cholesterol in 3-6 months.    -     Lipid Panel  TOBACCO ABUSE - I have recommended complete cessation of tobacco use. I have discussed various options available for  assistance with tobacco cessation including over the counter methods (Nicotine gum, patch and lozenges). We also discussed prescription options (Chantix, Nicotine Inhaler / Nasal Spray). The patient is not interested in pursuing any prescription tobacco cessation options at this time. - Patient declines at this time.    Essential hypertension Counseled on blood pressure goal of less than 130/80, low-sodium, DASH diet, medication compliance, 150 minutes of moderate intensity exercise per week. Discussed medication compliance, adverse effects.   Right foot pain Hx of foot problems -with paresthesia  -     Rheumatoid Arthritis Profile  Seizure (HCC) -     Ambulatory referral to Neurology  Paresthesia -     Rheumatoid Arthritis Profile -     Ambulatory referral to Neurology      gabapentin (NEURONTIN) 400 MG capsule; Take 1 capsule (400 mg total) by mouth 3 (three) times daily.  Class 1 obesity due to excess calories without serious comorbidity with body mass index (BMI) of 33.0 to 33.9 in adult Obesity is 30-39 indicating an excess in caloric intake or underlining conditions. This may lead to other co-morbidities. Lifestyle modifications of diet and exercise may reduce obesity.    Other orders/Medication refill -     amLODipine (NORVASC) 5 MG tablet; Take 1 tablet (5 mg total) by mouth daily. -     atorvastatin (LIPITOR) 40 MG tablet; Take 1 tablet (40 mg total) by mouth daily. -     gabapentin (NEURONTIN) 400 MG capsule; Take 1 capsule (400 mg total) by mouth 3 (three) times daily. -     valsartan (DIOVAN) 160 MG tablet; Take 1 tablet (160 mg total) by mouth daily.   Outpatient Encounter Medications as of 06/11/2021  Medication Sig   lamoTRIgine (LAMICTAL) 100 MG tablet Take 1 tablet by mouth daily.   omeprazole (PRILOSEC) 40 MG capsule Take by mouth.   [DISCONTINUED] amLODipine (NORVASC) 5 MG tablet Take 1 tablet by mouth daily.   [DISCONTINUED] atorvastatin (LIPITOR) 40 MG tablet Take  1 tablet by mouth daily.   [DISCONTINUED] gabapentin (NEURONTIN) 400 MG capsule Take 1 capsule (400 mg total) by mouth 4 (four) times daily.   [DISCONTINUED] valsartan (DIOVAN) 160 MG tablet Take 160 mg by mouth daily.   amLODipine (NORVASC) 5 MG tablet Take 1 tablet (5 mg total) by mouth daily.   atorvastatin (LIPITOR) 40 MG tablet Take 1 tablet (40 mg total) by mouth daily.   gabapentin (NEURONTIN) 400 MG capsule Take 1 capsule (400 mg total) by mouth 3 (three) times daily.   oxyCODONE (ROXICODONE) 5 MG immediate release tablet Take 1 tablet (5 mg total) by mouth every 4 (four) hours as needed for severe pain. (Patient not taking: Reported on 06/11/2021)   valsartan (DIOVAN) 160 MG tablet Take 1 tablet (160 mg total) by mouth daily.   No facility-administered encounter medications on file as of  06/11/2021.    Follow-up: Return in about 3 months (around 09/11/2021) for Pap/mam.   Grayce Sessions, NP

## 2021-06-11 NOTE — Patient Instructions (Signed)

## 2021-06-11 NOTE — Telephone Encounter (Signed)
Requested medication (s) are due for refill today:amount not specified  Requested medication (s) are on the active medication list: yes  Last refill: 05/24/21  amount not specified  Future visit scheduled yes  09/11/21  Notes to clinic:Not delegated, historical provider  Requested Prescriptions  Pending Prescriptions Disp Refills   lamoTRIgine (LAMICTAL) 100 MG tablet 30 tablet 11    Sig: Take 1 tablet (100 mg total) by mouth daily.      Not Delegated - Neurology:  Anticonvulsants Failed - 06/11/2021  5:02 PM      Failed - This refill cannot be delegated      Passed - HCT in normal range and within 360 days    HCT  Date Value Ref Range Status  05/24/2021 43.8 36.0 - 46.0 % Final          Passed - HGB in normal range and within 360 days    Hemoglobin  Date Value Ref Range Status  05/24/2021 14.0 12.0 - 15.0 g/dL Final          Passed - PLT in normal range and within 360 days    Platelets  Date Value Ref Range Status  05/24/2021 255 150 - 400 K/uL Final          Passed - WBC in normal range and within 360 days    WBC  Date Value Ref Range Status  05/24/2021 8.9 4.0 - 10.5 K/uL Final          Passed - Valid encounter within last 12 months    Recent Outpatient Visits           Today Encounter to establish care   [facility] Grayce Sessions, NP       Future Appointments             In 3 months Randa Evens, Kinnie Scales, NP [facility]

## 2021-06-11 NOTE — Progress Notes (Signed)
Pt states she is also having pain in right foot

## 2021-06-12 ENCOUNTER — Other Ambulatory Visit (INDEPENDENT_AMBULATORY_CARE_PROVIDER_SITE_OTHER): Payer: Self-pay | Admitting: Primary Care

## 2021-06-12 DIAGNOSIS — R569 Unspecified convulsions: Secondary | ICD-10-CM

## 2021-06-12 MED ORDER — LAMOTRIGINE 100 MG PO TABS: 100.0000 mg | ORAL_TABLET | Freq: Every day | ORAL | 1 refills | Status: DC

## 2021-06-12 NOTE — Telephone Encounter (Signed)
Sent to PCP to refill if appropriate.  

## 2021-06-13 ENCOUNTER — Other Ambulatory Visit (INDEPENDENT_AMBULATORY_CARE_PROVIDER_SITE_OTHER): Payer: Self-pay | Admitting: Primary Care

## 2021-06-13 ENCOUNTER — Telehealth (INDEPENDENT_AMBULATORY_CARE_PROVIDER_SITE_OTHER): Payer: Self-pay

## 2021-06-13 LAB — RHEUMATOID ARTHRITIS PROFILE
Cyclic Citrullin Peptide Ab: 6 units (ref 0–19)
Rheumatoid fact SerPl-aCnc: <10 IU/mL (ref <14.0)

## 2021-06-13 LAB — LIPID PANEL
Chol/HDL Ratio: 3.3 ratio (ref 0.0–4.4)
Cholesterol, Total: 212 mg/dL — ABNORMAL HIGH (ref 100–199)
HDL: 65 mg/dL (ref >39)
LDL Chol Calc (NIH): 135 mg/dL — ABNORMAL HIGH (ref 0–99)
Triglycerides: 65 mg/dL (ref 0–149)
VLDL Cholesterol Cal: 12 mg/dL (ref 5–40)

## 2021-06-13 MED ORDER — ATORVASTATIN CALCIUM 20 MG PO TABS: 20.0000 mg | ORAL_TABLET | Freq: Every day | ORAL | 1 refills | Status: DC

## 2021-06-13 NOTE — Telephone Encounter (Signed)
Patient verified date of birth. She is aware of results per PCP; also aware of medication being sent. She verbalized understanding. Maryjean Morn, CMA

## 2021-06-13 NOTE — Telephone Encounter (Signed)
-----   Message from Grayce Sessions, NP sent at 06/13/2021  1:59 PM EDT ----- Negative for arthritis but cholesterol is elevated.  Healthy lifestyle diet of fruits vegetables fish nuts whole grains and low saturated fat . Foods high in cholesterol or liver, fatty meats,cheese, butter avocados, nuts and seeds, chocolate and fried foods. Sent in atorvastatin 20 mg to take at bedtime

## 2021-06-20 ENCOUNTER — Telehealth (INDEPENDENT_AMBULATORY_CARE_PROVIDER_SITE_OTHER): Payer: Self-pay | Admitting: Primary Care

## 2021-06-20 NOTE — Telephone Encounter (Signed)
Pt states she cannot tolerate the atorvastatin (LIPITOR) 20 MG tablet  Pt states this med gave her headaches.  She took a couple days had bad headaches, she stopped the med and headaches went away. Pt would like a different medication for her cholesterol.  [phone] - [place], [place] - [address] AT [facility] [employer] [address]

## 2021-06-22 ENCOUNTER — Other Ambulatory Visit (INDEPENDENT_AMBULATORY_CARE_PROVIDER_SITE_OTHER): Payer: Self-pay | Admitting: Nurse Practitioner

## 2021-06-22 MED ORDER — PRAVASTATIN SODIUM 10 MG PO TABS: 10.0000 mg | ORAL_TABLET | Freq: Every day | ORAL | 0 refills | Status: DC

## 2021-06-22 NOTE — Telephone Encounter (Signed)
OK. I sent in pravastatin in a low dose for the patient to try. Thanks.

## 2021-06-27 ENCOUNTER — Other Ambulatory Visit (INDEPENDENT_AMBULATORY_CARE_PROVIDER_SITE_OTHER): Payer: Self-pay | Admitting: Nurse Practitioner

## 2021-06-28 ENCOUNTER — Encounter: Payer: Self-pay | Admitting: Neurology

## 2021-06-28 ENCOUNTER — Ambulatory Visit (INDEPENDENT_AMBULATORY_CARE_PROVIDER_SITE_OTHER): Payer: 59 | Admitting: Neurology

## 2021-06-28 VITALS — BP 135/85 | HR 84 | Ht 61.0 in | Wt 173.0 lb

## 2021-06-28 DIAGNOSIS — Z8661 Personal history of infections of the central nervous system: Secondary | ICD-10-CM | POA: Diagnosis not present

## 2021-06-28 DIAGNOSIS — G629 Polyneuropathy, unspecified: Secondary | ICD-10-CM | POA: Diagnosis not present

## 2021-06-28 DIAGNOSIS — M5481 Occipital neuralgia: Secondary | ICD-10-CM | POA: Diagnosis not present

## 2021-06-28 MED ORDER — AMITRIPTYLINE HCL 25 MG PO TABS: 25.0000 mg | ORAL_TABLET | Freq: Every day | ORAL | 0 refills | Status: DC

## 2021-06-28 NOTE — Progress Notes (Signed)
[facility]  PATIENT: Sheryl Brown DOB: 1961/06/28  REFERRING CLINICIAN: Grayce Sessions, NP HISTORY FROM: Patient and Care Everywhere REASON FOR VISIT: Seizure    HISTORICAL  CHIEF COMPLAINT:  Chief Complaint  Patient presents with   New Patient (Initial Visit)    Room 12 - alone. Hx of seizure. First event while she was having a c-section in 1978. She is currently taking lamotrigine 100mg , one tablet daily. Thinks last seizure was in 2018. Additionally, reporting numbness and tingling in bilateral feet and hands. Gabapentin 400mg , one cap TID. It is not very beneficial and makes her sleepy.    HISTORY OF PRESENT ILLNESS:  This is a 60 year old woman with past medical history of hypertension, hyperlipidemia, obstructive sleep apnea, and seizure who is presenting to establish care.  Patient states that she recently moved from to 67 in February 2022. She was under the care of Dr. [place] at [facility] in [place], she was being seen for numbness, headaches, focal seizures (sensory seizures) and TIA.  She is a poor historian, history is limited but I was able to find some information via Care Everywhere.  Patient reported the first seizure ever happened in 1978 after a C-section she cannot provide details around the event but said that that was the first time she was ever diagnosed with seizure.  Then she mentioned that she was seizure-free until 2018 when she was diagnosed with seizure, she does not remember what happened but believes that she had dizziness, numbness and was told that she had "silent seizures".  When asked to further describe the seizures she states she had some dizziness, some numbness but there is no reported loss of consciousness.  No reported abnormal movements associated with seizures.  For her seizures, she denies having a EEG in the past but per record review, there are a couple EEGs done and they were  normal, no epileptiform discharges.   She had additional complaint of left face numbness she reported this is intermittent on no pain associated with the left face but reports some left parietal occipital region.  She described her pain as sharp stabbing pain,. Intermittent pain.   She reports a diagnosis of meningitis, 2013, started with dizziness, couldn't see, remember waking up from the hospital, she was discharged to a nursing home prior to been sent home.   Patient lives by herself, sisters live in the same area, she is not currently working, she thinks her memory is okay, report sometimes does not remember. Sisters and cousins always complaint about her memory and they have been telling her to have it evaluated.   Numbness in feet and hand  Handedness: Right handed   Seizure Type: Unclear  Current frequency: Unclear, none for a long time   Any injuries from seizures: No  Seizure risk factors: None reported   Previous ASMs: Lamotrigine   Currenty ASMs: Lamotrigine 100 mg daily   ASMs side effects: None   Brain Images:  MRI 01/2019: Study is degraded by motion artifact. There is no restricted diffusion. There is no gross evidence of acute intracranial hemorrhage, extra-axial collection, midline shift, or mass effect. There is no gross evidence of abnormal signal of brain parenchyma or its coverings. Intracranial flow voids are grossly intact. Probable mucous retention cyst of the left sphenoid sinus  MRI 01/2018: The scalp and calvarium are normal. The posterior fossa and visualized upper cervical cord are unremarkable. The pituitary and sella are normal.  No abnormal  signal on diffusion weighted images reveal to suggest acute infarction. The susceptibility weighted sequences reveal no evidence of acute or chronic hemorrhage. The ventricles are normal in size and configuration without evidence of hydrocephalus. There are no areas of abnormal contrast enhancement.  Normal flow voids  are demonstrated in the carotid and basilar arteries. Scattered paranasal sinus mucosal thickening/retention cysts.  MRI 08/2017: Motion degraded study. No definite evidence of intracranial hemorrhage, mass effect, acute infarct, or abnormal intracranial enhancement.  Previous EEGs:  Routine EEG 03/2018: This is a normal study with adequate sampling of the wakeful, drowsy, and sleeping states.  No interictal epileptiform  activity is seen.  No seizure is recorded.    Routine EEG 01/2018: This is an unremarkable, limited array, EEG demonstrating brief wakefulness and sleep  CLINICAL INTERPRETATION:  No lateralized or epileptiform features were noted. A repeat EEG when the patient is able to have a full electrode array may provide additional detail.    OTHER MEDICAL CONDITIONS: Meningitis, HTN, GERD, HLD   REVIEW OF SYSTEMS: Full 14 system review of systems performed and negative with exception of: as noted in the HPI  ALLERGIES: No Known Allergies  HOME MEDICATIONS: Outpatient Medications Prior to Visit  Medication Sig Dispense Refill   amLODipine (NORVASC) 5 MG tablet Take 1 tablet (5 mg total) by mouth daily. 90 tablet 1   gabapentin (NEURONTIN) 400 MG capsule Take 1 capsule (400 mg total) by mouth 3 (three) times daily. 90 capsule 1   lamoTRIgine (LAMICTAL) 100 MG tablet Take 1 tablet (100 mg total) by mouth daily. 30 tablet 1   omeprazole (PRILOSEC) 40 MG capsule Take by mouth.     oxyCODONE (ROXICODONE) 5 MG immediate release tablet Take 1 tablet (5 mg total) by mouth every 4 (four) hours as needed for severe pain. 10 tablet 0   pravastatin (PRAVACHOL) 10 MG tablet Take 1 tablet (10 mg total) by mouth daily. 30 tablet 0   valsartan (DIOVAN) 160 MG tablet Take 1 tablet (160 mg total) by mouth daily. 90 tablet 1   No facility-administered medications prior to visit.    PAST MEDICAL HISTORY: Past Medical History:  Diagnosis Date   Meningitis 2013   Neuropathy    Seasonal allergies     Seizures (HCC)    Stroke (HCC)     PAST SURGICAL HISTORY: Past Surgical History:  Procedure Laterality Date   CESAREAN SECTION      FAMILY HISTORY: Family History  Problem Relation Age of Onset   Heart failure Mother    Cirrhosis Father     SOCIAL HISTORY: Social History   Socioeconomic History   Marital status: Legally Separated    Spouse name: Not on file   Number of children: 3   Years of education: some college   Highest education level: Not on file  Occupational History   Occupation: Disabled  Tobacco Use   Smoking status: Every Day    Packs/day: 0.50    Types: Cigarettes   Smokeless tobacco: Never  Vaping Use   Vaping Use: Never used  Substance and Sexual Activity   Alcohol use: Yes    Comment: only on holidays   Drug use: Never   Sexual activity: Never  Other Topics Concern   Not on file  Social History Narrative   Lives alone.   Right-handed.   No daily caffeine.    Social Determinants of Health   Financial Resource Strain: Not on file  Food Insecurity: Not on file  Transportation  Needs: Not on file  Physical Activity: Not on file  Stress: Not on file  Social Connections: Not on file  Intimate Partner Violence: Not on file     PHYSICAL EXAM GENERAL EXAM/CONSTITUTIONAL: Vitals:  Vitals:   06/28/21 1053  BP: 135/85  Pulse: 84  Weight: 173 lb (78.5 kg)  Height: 5\' 1"  (1.549 m)   Body mass index is 32.69 kg/m. Wt Readings from Last 3 Encounters:  06/28/21 173 lb (78.5 kg)  06/11/21 172 lb 9.6 oz (78.3 kg)  05/24/21 169 lb 6.4 oz (76.8 kg)   Patient is in no distress; well developed, nourished and groomed; neck is supple  CARDIOVASCULAR: Examination of carotid arteries is normal; no carotid bruits Regular rate and rhythm, no murmurs Examination of peripheral vascular system by observation and palpation is normal  EYES: Pupils round and reactive to light, Visual fields full to confrontation, Extraocular movements intacts,    MUSCULOSKELETAL: Gait, strength, tone, movements noted in Neurologic exam below  NEUROLOGIC: MENTAL STATUS:  awake, alert, oriented to person, place and time recent and remote memory intact normal attention and concentration language fluent, comprehension intact, naming intact fund of knowledge appropriate  CRANIAL NERVE:  2nd, 3rd, 4th, 6th - pupils equal and reactive to light, visual fields full to confrontation, extraocular muscles intact, no nystagmus 5th - facial sensation symmetric 7th - facial strength symmetric 8th - hearing intact 9th - palate elevates symmetrically, uvula midline 11th - shoulder shrug symmetric 12th - tongue protrusion midline  MOTOR:  normal bulk and tone, full strength in the BUE, BLE  SENSORY:  normal and symmetric to light touch, pinprick, temperature, vibration  COORDINATION:  finger-nose-finger, fine finger movements normal  REFLEXES:  deep tendon reflexes present and symmetric  GAIT/STATION:  normal   DIAGNOSTIC DATA (LABS, IMAGING, TESTING) - I reviewed patient records, labs, notes, testing and imaging myself where available.  Lab Results  Component Value Date   WBC 8.9 05/24/2021   HGB 14.0 05/24/2021   HCT 43.8 05/24/2021   MCV 92.6 05/24/2021   PLT 255 05/24/2021      Component Value Date/Time   NA 135 05/24/2021 1410   K 5.2 (H) 05/24/2021 1410   CL 106 05/24/2021 1410   CO2 20 (L) 05/24/2021 1410   GLUCOSE 93 05/24/2021 1410   BUN 22 (H) 05/24/2021 1410   CREATININE 0.88 05/24/2021 1410   CALCIUM 9.6 05/24/2021 1410   PROT 8.7 (H) 05/24/2021 1410   ALBUMIN 4.5 05/24/2021 1410   AST 27 05/24/2021 1410   ALT 32 05/24/2021 1410   ALKPHOS 68 05/24/2021 1410   BILITOT 0.9 05/24/2021 1410   GFRNONAA >60 05/24/2021 1410   Lab Results  Component Value Date   CHOL 212 (H) 06/11/2021   HDL 65 06/11/2021   LDLCALC 135 (H) 06/11/2021   TRIG 65 06/11/2021   No results found for: HGBA1C No results found for:  VITAMINB12 No results found for: TSH  MRI 01/2019: Study is degraded by motion artifact. There is no restricted diffusion. There is no gross evidence of acute intracranial hemorrhage, extra-axial collection, midline shift, or mass effect. There is no gross evidence of abnormal signal of brain parenchyma or its coverings. Intracranial flow voids are grossly intact. Probable mucous retention cyst of the left sphenoid sinus  MRI 01/2018: The scalp and calvarium are normal. The posterior fossa and visualized upper cervical cord are unremarkable. The pituitary and sella are normal.  No abnormal signal on diffusion weighted images reveal to suggest  acute infarction. The susceptibility weighted sequences reveal no evidence of acute or chronic hemorrhage. The ventricles are normal in size and configuration without evidence of hydrocephalus. There are no areas of abnormal contrast enhancement.  Normal flow voids are demonstrated in the carotid and basilar arteries. Scattered paranasal sinus mucosal thickening/retention cysts.  MRI 08/2017: Motion degraded study. No definite evidence of intracranial hemorrhage, mass effect, acute infarct, or abnormal intracranial enhancement.    Routine EEG 03/2018: This is a normal study with adequate sampling of the wakeful, drowsy, and sleeping states.  No interictal epileptiform  activity is seen.  No seizure is recorded.    Routine EEG 01/2018: This is an unremarkable, limited array, EEG demonstrating brief wakefulness and sleep  CLINICAL INTERPRETATION:  No lateralized or epileptiform features were noted. A repeat EEG when the patient is able to have a full electrode array may provide additional detail.   ASSESSMENT AND PLAN  60 y.o. year old female presenting today to establish care.  She has a past medical history of meningitis diagnosed in 2013, seizure first diagnosed in 1978 after C-section. She reports being seizure free until  2018 when she was diagnoses with  sensory seizure, headaches, left facial numbness, and bilateral feet and hands numbness.  Patient is a very poor historian and cannot provide further details regarding her seizures.  Per chart review she had multiple EEG which was negative and MRI brain which did not show any acute intracranial abnormality.  She was given a diagnosis of somatosensory seizures with a normal EEG.  She is currently on lamotrigine 100 mg daily.  Currently she cannot tell me when was the last time she had a seizure but does report some numbness intermittently in the bilateral hands and feet and also left facial numbness.  I am not quite certain that the patient numbness is the patient actual seizure. She did also complaint of left parieto-occipital sharp pain that is very similar to occipital neuralgia.  I will try her on amitriptyline.  She is currently on gabapentin for peripheral neuropathy.  I advised patient to continue her current medication.  Lastly, I strongly advised patient to present for follow up with her family member, someone who knows her well, knows her medical history well as she is a poor historian.  She also noted that family member has been complaining about her memory even though she thinks otherwise.   Localization:  Ddx:  1. Neuropathy   2. History of meningitis   3. Occipital neuralgia of left side     PLAN: Labs works today  You have been prescribed Amitriptyline 25 mg nightly.  Take 1/2 tablet every night for 4 nights and then 1 tablet thereafter  Continue your other medications as prescribed. Contact me for any questions or concerns. Return to clinic in 3 months, please bring a family member who knows you well at that visit.    [place] [place] [facility] [place]  [place], patients with seizures are not allowed to drive until they have been seizure-free for six months.  Other recommendations include using caution when using heavy equipment or power tools. Avoid working on ladders or at heights. Take  showers instead of baths.  Do not swim alone.  Ensure the water temperature is not too high on the home water heater. Do not go swimming alone. Do not lock yourself in a room alone (i.e. bathroom). When caring for infants or small children, sit down when holding, feeding, or changing them to minimize risk of injury to  the child in the event you have a seizure. Maintain good sleep hygiene. Avoid alcohol.  Also recommend adequate sleep, hydration, good diet and minimize stress.   During the Seizure  - First, ensure adequate ventilation and place patients on the floor on their left side  Loosen clothing around the neck and ensure the airway is patent. If the patient is clenching the teeth, do not force the mouth open with any object as this can cause severe damage - Remove all items from the surrounding that can be hazardous. The patient may be oblivious to what's happening and may not even know what he or she is doing. If the patient is confused and wandering, either gently guide him/her away and block access to outside areas - Reassure the individual and be comforting - Call 911. In most cases, the seizure ends before EMS arrives. However, there are cases when seizures may last over 3 to 5 minutes. Or the individual may have developed breathing difficulties or severe injuries. If a pregnant patient or a person with diabetes develops a seizure, it is prudent to call an ambulance. - Finally, if the patient does not regain full consciousness, then call EMS. Most patients will remain confused for about 45 to 90 minutes after a seizure, so you must use judgment in calling for help. - Avoid restraints but make sure the patient is in a bed with padded side rails - Place the individual in a lateral position with the neck slightly flexed; this will help the saliva drain from the mouth and prevent the tongue from falling backward - Remove all nearby furniture and other hazards from the area - Provide verbal  assurance as the individual is regaining consciousness - Provide the patient with privacy if possible - Call for help and start treatment as ordered by the caregiver   After the Seizure (Postictal Stage)  After a seizure, most patients experience confusion, fatigue, muscle pain and/or a headache. Thus, one should permit the individual to sleep. For the next few days, reassurance is essential. Being calm and helping reorient the person is also of importance.  Most seizures are painless and end spontaneously. Seizures are not harmful to others but can lead to complications such as stress on the lungs, brain and the heart. Individuals with prior lung problems may develop labored breathing and respiratory distress.     Orders Placed This Encounter  Procedures   Vitamin B12   VITAMIN D 25 Hydroxy (Vit-D Deficiency, Fractures)   Hemoglobin A1c   Sedimentation Rate   C-reactive Protein    Meds ordered this encounter  Medications   amitriptyline (ELAVIL) 25 MG tablet    Sig: Take 1 tablet (25 mg total) by mouth at bedtime.    Dispense:  30 tablet    Refill:  0    Return in about 3 months (around 09/28/2021).    Windell Norfolk, MD 06/28/2021, 1:30 PM  [facility] [address], [address] [place], [place] [address] [phone]

## 2021-06-28 NOTE — Patient Instructions (Addendum)
Labs works today  You have been prescribed Amitriptyline 25 mg nightly.  Take 1/2 tablet every night for 4 nights and then 1 tablet thereafter  Continue your other medications as prescribed. Contact me for any questions or concerns. Return to clinic in 3 months, please bring a family member who knows you well at that visit.

## 2021-06-29 LAB — VITAMIN B12: Vitamin B-12: 1124 pg/mL (ref 232–1245)

## 2021-06-29 LAB — SEDIMENTATION RATE: Sed Rate: 17 mm/hr (ref 0–40)

## 2021-06-29 LAB — HEMOGLOBIN A1C
Est. average glucose Bld gHb Est-mCnc: 105 mg/dL
Hgb A1c MFr Bld: 5.3 % (ref 4.8–5.6)

## 2021-06-29 LAB — VITAMIN D 25 HYDROXY (VIT D DEFICIENCY, FRACTURES): Vit D, 25-Hydroxy: 23.6 ng/mL — ABNORMAL LOW (ref 30.0–100.0)

## 2021-06-29 LAB — C-REACTIVE PROTEIN: CRP: 5 mg/L (ref 0–10)

## 2021-07-01 ENCOUNTER — Encounter (HOSPITAL_COMMUNITY): Payer: Self-pay | Admitting: Oncology

## 2021-07-01 ENCOUNTER — Emergency Department (HOSPITAL_COMMUNITY)
Admission: EM | Admit: 2021-07-01 | Discharge: 2021-07-01 | Disposition: A | Discharge status: Home / Self Care | Payer: 59 | Attending: Emergency Medicine | Admitting: Emergency Medicine

## 2021-07-01 ENCOUNTER — Other Ambulatory Visit: Payer: Self-pay

## 2021-07-01 DIAGNOSIS — K0889 Other specified disorders of teeth and supporting structures: Secondary | ICD-10-CM | POA: Diagnosis present

## 2021-07-01 DIAGNOSIS — K047 Periapical abscess without sinus: Secondary | ICD-10-CM | POA: Diagnosis not present

## 2021-07-01 DIAGNOSIS — R220 Localized swelling, mass and lump, head: Secondary | ICD-10-CM

## 2021-07-01 DIAGNOSIS — F17210 Nicotine dependence, cigarettes, uncomplicated: Secondary | ICD-10-CM | POA: Insufficient documentation

## 2021-07-01 DIAGNOSIS — Z79899 Other long term (current) drug therapy: Secondary | ICD-10-CM | POA: Diagnosis not present

## 2021-07-01 DIAGNOSIS — I10 Essential (primary) hypertension: Secondary | ICD-10-CM | POA: Diagnosis not present

## 2021-07-01 MED ORDER — AMOXICILLIN-POT CLAVULANATE 875-125 MG PO TABS
1.0000 | ORAL_TABLET | Freq: Once | ORAL | Status: AC
  Administered 2021-07-01: 1 via ORAL
  Filled 2021-07-01: qty 1

## 2021-07-01 MED ORDER — OXYCODONE-ACETAMINOPHEN 5-325 MG PO TABS
1.0000 | ORAL_TABLET | Freq: Once | ORAL | Status: AC
  Administered 2021-07-01: 1 via ORAL
  Filled 2021-07-01: qty 1

## 2021-07-01 MED ORDER — TRAMADOL HCL 50 MG PO TABS: 50.0000 mg | ORAL_TABLET | Freq: Four times a day (QID) | ORAL | 0 refills | Status: DC | PRN

## 2021-07-01 MED ORDER — AMOXICILLIN-POT CLAVULANATE 875-125 MG PO TABS: 1.0000 | ORAL_TABLET | Freq: Two times a day (BID) | ORAL | 0 refills | Status: DC

## 2021-07-01 MED ORDER — IBUPROFEN 200 MG PO TABS
400.0000 mg | ORAL_TABLET | Freq: Once | ORAL | Status: AC
  Administered 2021-07-01: 400 mg via ORAL
  Filled 2021-07-01: qty 2

## 2021-07-01 MED ORDER — AMLODIPINE BESYLATE 5 MG PO TABS
5.0000 mg | ORAL_TABLET | Freq: Once | ORAL | Status: AC
  Administered 2021-07-01: 5 mg via ORAL
  Filled 2021-07-01: qty 1

## 2021-07-01 NOTE — ED Provider Notes (Signed)
[facility]-EMERGENCY DEPT Provider Note   CSN: [id number] Arrival date & time: 07/01/21  1241     History Chief Complaint  Patient presents with   Facial Swelling    Sheryl Brown is a 60 y.o. female.  Patient c/o right lower dental/gum pain and swelling in past two days, and today with increased swelling on face along left mandible. Symptoms acute onset, moderate, constant, persistent, dull, non radiating. No sore throat or trouble swallowing. No trouble breathing. No change in voice. No hx same symptoms. Has not taken anything for pain today. No recent abx use. No fever or chills.   The history is provided by the patient and medical records.      Past Medical History:  Diagnosis Date   Meningitis 2013   Neuropathy    Seasonal allergies    Seizures (HCC)    Stroke [facility])     Patient Active Problem List   Diagnosis Date Noted   Vitamin D deficiency 09/01/2019   Seizure (HCC) 01/18/2019   TIA (transient ischemic attack) 01/18/2019   HTN (hypertension) 01/28/2018   Hyperlipidemia 06/03/2017   Chronic bilateral low back pain without sciatica 12/12/2016   URI 01/20/2009   TOBACCO ABUSE 12/08/2008   CANDIDIASIS, VAGINAL 10/21/2008   DEPRESSION, SITUATIONAL 05/31/2008   DENTAL PAIN 02/23/2008   ACUTE GINGIVITIS NONPLAQUE INDUCED 12/24/2007   INGUINAL PAIN, RIGHT 12/24/2007   UTI 11/26/2007   ROTATOR CUFF SYNDROME, RIGHT 07/24/2007   GERD 07/23/2007   ELEVATED BLOOD PRESSURE WITHOUT DIAGNOSIS OF HYPERTENSION 05/28/2007   SYMPTOM, PAIN, ABDOMINAL, GENERALIZED 11/25/2006   LABYRINTHITIS, HX OF 09/11/2006    Past Surgical History:  Procedure Laterality Date   CESAREAN SECTION       OB History   No obstetric history on file.     Family History  Problem Relation Age of Onset   Heart failure Mother    Cirrhosis Father     Social History   Tobacco Use   Smoking status: Every Day    Packs/day: 0.50    Types: Cigarettes   Smokeless  tobacco: Never  Vaping Use   Vaping Use: Never used  Substance Use Topics   Alcohol use: Yes    Comment: only on holidays   Drug use: Never    Home Medications Prior to Admission medications   Medication Sig Start Date End Date Taking? Authorizing Provider  amitriptyline (ELAVIL) 25 MG tablet Take 1 tablet (25 mg total) by mouth at bedtime. 06/28/21 07/28/21  Windell Norfolk, MD  amLODipine (NORVASC) 5 MG tablet Take 1 tablet (5 mg total) by mouth daily. 06/11/21 06/11/22  Grayce Sessions, NP  gabapentin (NEURONTIN) 400 MG capsule Take 1 capsule (400 mg total) by mouth 3 (three) times daily. 06/11/21 07/11/21  Grayce Sessions, NP  lamoTRIgine (LAMICTAL) 100 MG tablet Take 1 tablet (100 mg total) by mouth daily. 06/12/21 06/12/22  Grayce Sessions, NP  omeprazole (PRILOSEC) 40 MG capsule Take by mouth. 06/29/20   [provider]  oxyCODONE (ROXICODONE) 5 MG immediate release tablet Take 1 tablet (5 mg total) by mouth every 4 (four) hours as needed for severe pain. 02/19/21   Henderly, Britni A, PA-C  pravastatin (PRAVACHOL) 10 MG tablet Take 1 tablet (10 mg total) by mouth daily. 06/22/21 07/22/21  Ivonne Andrew, NP  valsartan (DIOVAN) 160 MG tablet Take 1 tablet (160 mg total) by mouth daily. 06/11/21   Grayce Sessions, NP    Allergies  Patient has no known allergies.  Review of Systems   Review of Systems  Constitutional:  Negative for chills and fever.  HENT:  Positive for dental problem. Negative for sore throat.   Eyes:  Negative for redness.  Respiratory:  Negative for cough and shortness of breath.   Cardiovascular:  Negative for chest pain.  Gastrointestinal:  Negative for nausea and vomiting.  Genitourinary:  Negative for flank pain.  Musculoskeletal:  Negative for neck pain.  Skin:  Negative for rash.  Neurological:  Negative for headaches.  Hematological:  Does not bruise/bleed easily.  Psychiatric/Behavioral:  Negative for confusion.    Physical  Exam Updated Vital Signs BP (!) 189/99   Pulse 91   Temp 98.7 F (37.1 C) (Oral)   Resp 16   Ht 1.524 m (5')   Wt 78.5 kg   SpO2 99%   BMI 33.79 kg/m   Physical Exam Vitals and nursing note reviewed.  Constitutional:      Appearance: Normal appearance. She is well-developed.  HENT:     Head: Atraumatic.     Nose: Nose normal.     Mouth/Throat:     Mouth: Mucous membranes are moist.     Pharynx: Oropharynx is clear. No oropharyngeal exudate or posterior oropharyngeal erythema.     Comments: Left lower dental decay, associated gum swelling, and soft tissue swelling to left lower face/mandible area. No crepitus. No pain, swelling, or tenderness to floor of mouth.  Eyes:     General: No scleral icterus.    Conjunctiva/sclera: Conjunctivae normal.  Neck:     Trachea: No tracheal deviation.     Comments: Trachea midline. No mass, swelling, or tenderness to neck. No neck stiffness or rigidity.  Cardiovascular:     Rate and Rhythm: Normal rate and regular rhythm.     Pulses: Normal pulses.     Heart sounds: Normal heart sounds. No murmur heard.   No friction rub. No gallop.  Pulmonary:     Effort: Pulmonary effort is normal. No respiratory distress.     Breath sounds: Normal breath sounds. No stridor.  Abdominal:     General: There is no distension.  Genitourinary:    Comments: No cva tenderness.  Musculoskeletal:        General: No swelling.     Cervical back: Normal range of motion and neck supple. No rigidity. No muscular tenderness.  Skin:    General: Skin is warm and dry.     Findings: No rash.  Neurological:     Mental Status: She is alert.     Comments: Alert, speech normal.   Psychiatric:        Mood and Affect: Mood normal.    ED Results / Procedures / Treatments   Labs (all labs ordered are listed, but only abnormal results are displayed) Labs Reviewed - No data to display  EKG None  Radiology No results found.  Procedures Procedures    Medications Ordered in ED Medications  amoxicillin-clavulanate (AUGMENTIN) 875-125 MG per tablet 1 tablet (has no administration in time range)  ibuprofen (ADVIL) tablet 400 mg (has no administration in time range)  oxyCODONE-acetaminophen (PERCOCET/ROXICET) 5-325 MG per tablet 1 tablet (has no administration in time range)    ED Course  I have reviewed the triage vital signs and the nursing notes.  Pertinent labs & imaging results that were available during my care of the patient were reviewed by me and considered in my medical decision making (see chart for  details).    MDM Rules/Calculators/A&P                          Exam c/w dental abscess.  Reviewed nursing notes and prior charts for additional history.   No meds pta. Confirmed nkda.   Augmentin po. Ibuprofen po. Percocet po.   Rec close dental f/u tomorrow.   Return precautions provided.    Final Clinical Impression(s) / ED Diagnoses Final diagnoses:  None    Rx / DC Orders ED Discharge Orders     None        Cathren Laine, MD 07/04/21 1537

## 2021-07-01 NOTE — ED Triage Notes (Signed)
Pt woke up w/ significant left facial swelling.  States she had dental pain yesterday.  Pt also c/o HA, CP and dizziness.

## 2021-07-01 NOTE — Discharge Instructions (Addendum)
It was our pleasure to provide your ER care today - we hope that you feel better.  Take antibiotic as prescribed. Warm compresses to sore area. Follow up with dentist tomorrow.   Take acetaminophen or ibuprofen as need for pain. You may also take ultram as need for pain - no driving for the next 6 hours or when taking ultram.   Your blood pressure is high today - continue your meds, limit salt intake, and follow up with primary care doctor in the coming week.   Return to ER if worse, new symptoms, high fevers, spreading redness, increased swelling, trouble breathing or unable to swallow, or other concern.

## 2021-07-17 ENCOUNTER — Other Ambulatory Visit (INDEPENDENT_AMBULATORY_CARE_PROVIDER_SITE_OTHER): Payer: Self-pay | Admitting: Primary Care

## 2021-07-17 NOTE — Telephone Encounter (Signed)
Patient was prescribed pravastatin at last OV by Angus Seller. Please advise which cholesterol medication should be refilled and send to pharmacy.

## 2021-07-22 ENCOUNTER — Other Ambulatory Visit (INDEPENDENT_AMBULATORY_CARE_PROVIDER_SITE_OTHER): Payer: Self-pay | Admitting: Primary Care

## 2021-07-22 DIAGNOSIS — E782 Mixed hyperlipidemia: Secondary | ICD-10-CM

## 2021-07-22 MED ORDER — ATORVASTATIN CALCIUM 20 MG PO TABS: 20.0000 mg | ORAL_TABLET | Freq: Every day | ORAL | 3 refills | Status: DC

## 2021-08-04 ENCOUNTER — Other Ambulatory Visit: Payer: Self-pay | Admitting: Neurology

## 2021-08-06 NOTE — Telephone Encounter (Signed)
Called patient to find out how she is doing with the amitiptyline.  Patient stated she didn't like it, it makes her too tired in the morning, even with her taking it at bedtime, although her pain is somewhat better.  Patient is asking for something else to take.    Message forwarded to Dr. Teresa Coombs for review and suggestions.

## 2021-08-06 NOTE — Telephone Encounter (Signed)
Called patient back after receiving Dr. Karie Georges suggestion and revised prescription.  Patient was receptive to change dosing to 1/2 tablet at night and see how that works for her and reduce the amount of sleepiness in the morning.    Patient denied further questions, verbalized understanding and expressed appreciation for the phone call.

## 2021-09-05 ENCOUNTER — Ambulatory Visit: Payer: 59 | Admitting: Family

## 2021-09-11 ENCOUNTER — Ambulatory Visit (INDEPENDENT_AMBULATORY_CARE_PROVIDER_SITE_OTHER): Payer: 59 | Admitting: Primary Care

## 2021-09-12 ENCOUNTER — Other Ambulatory Visit (INDEPENDENT_AMBULATORY_CARE_PROVIDER_SITE_OTHER): Payer: Self-pay | Admitting: Primary Care

## 2021-09-27 ENCOUNTER — Ambulatory Visit: Payer: 59 | Admitting: Neurology

## 2021-10-03 DIAGNOSIS — Z8673 Personal history of transient ischemic attack (TIA), and cerebral infarction without residual deficits: Secondary | ICD-10-CM

## 2021-10-08 ENCOUNTER — Ambulatory Visit: Payer: 59 | Admitting: Neurology

## 2021-10-09 ENCOUNTER — Encounter: Payer: Self-pay | Admitting: Neurology

## 2021-10-26 DIAGNOSIS — M7501 Adhesive capsulitis of right shoulder: Secondary | ICD-10-CM | POA: Insufficient documentation

## 2021-11-26 ENCOUNTER — Emergency Department (HOSPITAL_COMMUNITY): Payer: Medicare Other

## 2021-11-26 ENCOUNTER — Other Ambulatory Visit: Payer: Self-pay

## 2021-11-26 ENCOUNTER — Emergency Department (HOSPITAL_COMMUNITY)
Admission: EM | Admit: 2021-11-26 | Discharge: 2021-11-26 | Disposition: A | Discharge status: Home / Self Care | Payer: Medicare Other | Attending: Student | Admitting: Student

## 2021-11-26 ENCOUNTER — Encounter (HOSPITAL_COMMUNITY): Payer: Self-pay

## 2021-11-26 DIAGNOSIS — F17210 Nicotine dependence, cigarettes, uncomplicated: Secondary | ICD-10-CM | POA: Diagnosis not present

## 2021-11-26 DIAGNOSIS — I10 Essential (primary) hypertension: Secondary | ICD-10-CM | POA: Diagnosis not present

## 2021-11-26 DIAGNOSIS — N9489 Other specified conditions associated with female genital organs and menstrual cycle: Secondary | ICD-10-CM | POA: Insufficient documentation

## 2021-11-26 DIAGNOSIS — R791 Abnormal coagulation profile: Secondary | ICD-10-CM | POA: Diagnosis not present

## 2021-11-26 DIAGNOSIS — R200 Anesthesia of skin: Secondary | ICD-10-CM

## 2021-11-26 DIAGNOSIS — R202 Paresthesia of skin: Secondary | ICD-10-CM | POA: Insufficient documentation

## 2021-11-26 DIAGNOSIS — Z79899 Other long term (current) drug therapy: Secondary | ICD-10-CM | POA: Insufficient documentation

## 2021-11-26 DIAGNOSIS — Z20822 Contact with and (suspected) exposure to covid-19: Secondary | ICD-10-CM | POA: Diagnosis not present

## 2021-11-26 LAB — URINALYSIS, ROUTINE W REFLEX MICROSCOPIC
Bilirubin Urine: NEGATIVE
Glucose, UA: NEGATIVE mg/dL
Hgb urine dipstick: NEGATIVE
Ketones, ur: NEGATIVE mg/dL
Leukocytes,Ua: NEGATIVE
Nitrite: NEGATIVE
Protein, ur: NEGATIVE mg/dL
Specific Gravity, Urine: 1.014 (ref 1.005–1.030)
pH: 5 (ref 5.0–8.0)

## 2021-11-26 LAB — DIFFERENTIAL
Abs Immature Granulocytes: 0.04 10*3/uL (ref 0.00–0.07)
Basophils Absolute: 0.1 10*3/uL (ref 0.0–0.1)
Basophils Relative: 1 %
Eosinophils Absolute: 0.2 10*3/uL (ref 0.0–0.5)
Eosinophils Relative: 2 %
Immature Granulocytes: 0 %
Lymphocytes Relative: 31 %
Lymphs Abs: 2.8 10*3/uL (ref 0.7–4.0)
Monocytes Absolute: 0.8 10*3/uL (ref 0.1–1.0)
Monocytes Relative: 9 %
Neutro Abs: 5.2 10*3/uL (ref 1.7–7.7)
Neutrophils Relative %: 57 %

## 2021-11-26 LAB — CBC
HCT: 41 % (ref 36.0–46.0)
Hemoglobin: 13.2 g/dL (ref 12.0–15.0)
MCH: 30.4 pg (ref 26.0–34.0)
MCHC: 32.2 g/dL (ref 30.0–36.0)
MCV: 94.5 fL (ref 80.0–100.0)
Platelets: 255 10*3/uL (ref 150–400)
RBC: 4.34 MIL/uL (ref 3.87–5.11)
RDW: 13.8 % (ref 11.5–15.5)
WBC: 9 10*3/uL (ref 4.0–10.5)
nRBC: 0 % (ref 0.0–0.2)

## 2021-11-26 LAB — RESP PANEL BY RT-PCR (FLU A&B, COVID) ARPGX2
Influenza A by PCR: NEGATIVE
Influenza B by PCR: NEGATIVE
SARS Coronavirus 2 by RT PCR: NEGATIVE

## 2021-11-26 LAB — COMPREHENSIVE METABOLIC PANEL
ALT: 31 U/L (ref 0–44)
AST: 28 U/L (ref 15–41)
Albumin: 4 g/dL (ref 3.5–5.0)
Alkaline Phosphatase: 65 U/L (ref 38–126)
Anion gap: 7 (ref 5–15)
BUN: 17 mg/dL (ref 6–20)
CO2: 24 mmol/L (ref 22–32)
Calcium: 8.8 mg/dL — ABNORMAL LOW (ref 8.9–10.3)
Chloride: 107 mmol/L (ref 98–111)
Creatinine, Ser: 0.82 mg/dL (ref 0.44–1.00)
GFR, Estimated: >60 mL/min (ref >60)
Glucose, Bld: 89 mg/dL (ref 70–99)
Potassium: 4.7 mmol/L (ref 3.5–5.1)
Sodium: 138 mmol/L (ref 135–145)
Total Bilirubin: 0.6 mg/dL (ref 0.3–1.2)
Total Protein: 7.7 g/dL (ref 6.5–8.1)

## 2021-11-26 LAB — RAPID URINE DRUG SCREEN, HOSP PERFORMED
Amphetamines: NOT DETECTED
Barbiturates: NOT DETECTED
Benzodiazepines: NOT DETECTED
Cocaine: NOT DETECTED
Opiates: NOT DETECTED
Tetrahydrocannabinol: NOT DETECTED

## 2021-11-26 LAB — I-STAT BETA HCG BLOOD, ED (MC, WL, AP ONLY): I-stat hCG, quantitative: 9.2 m[IU]/mL — ABNORMAL HIGH (ref <5)

## 2021-11-26 LAB — PROTIME-INR
INR: 1 (ref 0.8–1.2)
Prothrombin Time: 12.9 seconds (ref 11.4–15.2)

## 2021-11-26 LAB — APTT: aPTT: 30 seconds (ref 24–36)

## 2021-11-26 MED ORDER — LORAZEPAM 2 MG/ML IJ SOLN
0.5000 mg | Freq: Once | INTRAMUSCULAR | Status: AC | PRN
  Administered 2021-11-26: 0.5 mg via INTRAVENOUS
  Filled 2021-11-26: qty 1

## 2021-11-26 NOTE — ED Provider Notes (Signed)
[facility]-EMERGENCY DEPT Provider Note  CSN: [id number] Arrival date & time: 11/26/21 1027  Chief Complaint(s) facial numbness and Dizziness  HPI Sheryl Brown is a 61 y.o. female with PMH with PMH meningitis, seizure disorder, previous stroke with resolved left-sided numbness who presents with left-sided facial numbness.  She states that she went to sleep at approximately 11 PM last night and awoke at 7 AM this morning with left-sided facial numbness in the distribution of V2.  Over the course of the following 4 hours the facial numbness spread to all of the facial dermatomes.  She denies numbness, tingling, weakness of the upper or lower extremities.  Denies chest pain, shortness of breath, Donnell pain, nausea, vomiting or other systemic symptoms.   Dizziness  Past Medical History Past Medical History:  Diagnosis Date   Meningitis 2013   Neuropathy    Seasonal allergies    Seizures (HCC)    Stroke [facility](HCC)    Patient Active Problem List   Diagnosis Date Noted   Vitamin D deficiency 09/01/2019   Seizure (HCC) 01/18/2019   TIA (transient ischemic attack) 01/18/2019   HTN (hypertension) 01/28/2018   Hyperlipidemia 06/03/2017   Chronic bilateral low back pain without sciatica 12/12/2016   URI 01/20/2009   TOBACCO ABUSE 12/08/2008   CANDIDIASIS, VAGINAL 10/21/2008   DEPRESSION, SITUATIONAL 05/31/2008   DENTAL PAIN 02/23/2008   ACUTE GINGIVITIS NONPLAQUE INDUCED 12/24/2007   INGUINAL PAIN, RIGHT 12/24/2007   UTI 11/26/2007   ROTATOR CUFF SYNDROME, RIGHT 07/24/2007   GERD 07/23/2007   ELEVATED BLOOD PRESSURE WITHOUT DIAGNOSIS OF HYPERTENSION 05/28/2007   SYMPTOM, PAIN, ABDOMINAL, GENERALIZED 11/25/2006   LABYRINTHITIS, HX OF 09/11/2006   Home Medication(s) Prior to Admission medications   Medication Sig Start Date End Date Taking? Authorizing Provider  amitriptyline (ELAVIL) 25 MG tablet Take 0.5 tablets (12.5 mg total) by mouth at bedtime. 08/06/21    Windell Norfolkamara, Amadou, MD  amLODipine (NORVASC) 5 MG tablet TAKE 1 TABLET(5 MG) BY MOUTH DAILY 09/12/21   Grayce SessionsEdwards, Michelle P, NP  atorvastatin (LIPITOR) 20 MG tablet Take 1 tablet (20 mg total) by mouth daily. 07/22/21   Grayce SessionsEdwards, Michelle P, NP  gabapentin (NEURONTIN) 400 MG capsule Take 1 capsule (400 mg total) by mouth 3 (three) times daily. Patient not taking: Reported on 11/20/2021 06/11/21 07/11/21  Grayce SessionsEdwards, Michelle P, NP  lamoTRIgine (LAMICTAL) 100 MG tablet Take 1 tablet (100 mg total) by mouth daily. 06/12/21 06/12/22  Grayce SessionsEdwards, Michelle P, NP  omeprazole (PRILOSEC) 40 MG capsule Take 40 mg by mouth daily. 06/29/20   [provider]  PARoxetine (PAXIL) 20 MG tablet Take 20 mg by mouth daily. 10/03/21   [provider]  valsartan (DIOVAN) 160 MG tablet TAKE 1 TABLET(160 MG) BY MOUTH DAILY 09/12/21   Grayce SessionsEdwards, Michelle P, NP  Past Surgical History Past Surgical History:  Procedure Laterality Date   CESAREAN SECTION     Family History Family History  Problem Relation Age of Onset   Heart failure Mother    Cirrhosis Father     Social History Social History   Tobacco Use   Smoking status: Every Day    Packs/day: 0.25    Types: Cigarettes   Smokeless tobacco: Never  Vaping Use   Vaping Use: Never used  Substance Use Topics   Alcohol use: Yes    Comment: only on holidays   Drug use: Never   Allergies Gabapentin  Review of Systems Review of Systems  Neurological:  Positive for numbness.   Physical Exam Vital Signs  I have reviewed the triage vital signs BP (!) 155/104 (BP Location: Left Arm)    Pulse (!) 107    Temp 98 F (36.7 C) (Oral)    Resp 16    Ht 5' (1.524 m)    Wt 79.8 kg    SpO2 98%    BMI 34.37 kg/m   Physical Exam Vitals and nursing note reviewed.  Constitutional:      General: She is not in acute distress.    Appearance:  She is well-developed.  HENT:     Head: Normocephalic and atraumatic.  Eyes:     Conjunctiva/sclera: Conjunctivae normal.  Cardiovascular:     Rate and Rhythm: Normal rate and regular rhythm.     Heart sounds: No murmur heard. Pulmonary:     Effort: Pulmonary effort is normal. No respiratory distress.     Breath sounds: Normal breath sounds.  Abdominal:     Palpations: Abdomen is soft.     Tenderness: There is no abdominal tenderness.  Musculoskeletal:        General: No swelling.     Cervical back: Neck supple.  Skin:    General: Skin is warm and dry.     Capillary Refill: Capillary refill takes less than 2 seconds.  Neurological:     Mental Status: She is alert.     Cranial Nerves: Cranial nerve deficit (Subjective facial numbness) present.     Sensory: Sensory deficit present.     Motor: No weakness.  Psychiatric:        Mood and Affect: Mood normal.    ED Results and Treatments Labs (all labs ordered are listed, but only abnormal results are displayed) Labs Reviewed  RESP PANEL BY RT-PCR (FLU A&B, COVID) ARPGX2  ETHANOL  PROTIME-INR  APTT  CBC  DIFFERENTIAL  COMPREHENSIVE METABOLIC PANEL  RAPID URINE DRUG SCREEN, HOSP PERFORMED  URINALYSIS, ROUTINE W REFLEX MICROSCOPIC  I-STAT BETA HCG BLOOD, ED ([facility], [facility], [facility] ONLY)                                                                                                                          Radiology No results found.  Pertinent labs & imaging results that were available during my care of  the patient were reviewed by me and considered in my medical decision making (see MDM for details).  Medications Ordered in ED Medications  LORazepam (ATIVAN) injection 0.5 mg (has no administration in time range)                                                                                                                                     Procedures Procedures  (including critical care time)  Medical Decision Making / ED  Course   This patient presents to the ED for concern of facial numbness, this involves an extensive number of treatment options, and is a complaint that carries with it a high risk of complications and morbidity.  The differential diagnosis includes CVA, recrudescence, peripheral nerve injury, Ramsay Hunt  MDM: Patient seen emergency department for evaluation of facial numbness.  Physical exam revealing subjective facial numbness in the distribution of V1 through V3 on the left.  Does not cross midline.  No facial weakness.  No additional focal motor or sensory deficits.  Patient outside the window for tPA and stroke alert not called.  Laboratory evaluation unremarkable.  Initial i-STAT hCG 9.2 and we will follow-up with a repeat urine hCG to ensure that this is a false positive.  COVID and flu negative.  CT head unremarkable.  Neurology was consulted who recommended reconsultation after MRI.  MRI of the brain is currently pending.  Patient then signed out to oncoming provider.  Please see provider signout for continuation of work-up.   Additional history obtained:  -External records from outside source obtained and reviewed including: Chart review including previous notes, labs, imaging, consultation notes   Lab Tests: -I ordered, reviewed, and interpreted labs.   The pertinent results include:   Labs Reviewed  RESP PANEL BY RT-PCR (FLU A&B, COVID) ARPGX2  ETHANOL  PROTIME-INR  APTT  CBC  DIFFERENTIAL  COMPREHENSIVE METABOLIC PANEL  RAPID URINE DRUG SCREEN, HOSP PERFORMED  URINALYSIS, ROUTINE W REFLEX MICROSCOPIC  I-STAT BETA HCG BLOOD, ED ([facility], [facility], [facility] ONLY)      EKG   EKG Interpretation  Date/Time:  Monday November 26 2021 11:18:56 EST Ventricular Rate:  88 PR Interval:  129 QRS Duration: 75 QT Interval:  353 QTC Calculation: 428 R Axis:   48 Text Interpretation: Sinus rhythm Confirmed by Lewie Deman ([id number]) on 11/26/2021 2:33:28 PM         Imaging Studies ordered: I  ordered imaging studies including CT head, MRI brain I independently visualized and interpreted imaging. I agree with the radiologist interpretation   Medicines ordered and prescription drug management: Meds ordered this encounter  Medications   LORazepam (ATIVAN) injection 0.5 mg    -I have reviewed the patients home medicines and have made adjustments as needed  Critical interventions none  Consultations Obtained: I requested consultation with the neurology,  and discussed lab and imaging findings as well as pertinent plan - they recommend: MRI  Cardiac Monitoring: The patient was maintained on a cardiac monitor.  I personally viewed and interpreted the cardiac monitored which showed an underlying rhythm of: NSR  Social Determinants of Health:  Factors impacting patients care include: none   Reevaluation: After the interventions noted above, I reevaluated the patient and found that they have :stayed the same  Co morbidities that complicate the patient evaluation  Past Medical History:  Diagnosis Date   Meningitis 2013   Neuropathy    Seasonal allergies    Seizures (HCC)    Stroke [facility](HCC)       Dispostion: Pending MRI     Final Clinical Impression(s) / ED Diagnoses Final diagnoses:  None     @PCDICTATION @    Glendora ScoreKommor, Daxtin Leiker, MD 11/26/21 1435

## 2021-11-26 NOTE — ED Notes (Signed)
Pt states understanding of dc instructions, importance of follow up.  Pt denies questions or concerns upon dc. Pt declined wheelchair assistance upon dc. Pt ambulated out of ed w/ steady gait. No belongings left in room upon dc.  

## 2021-11-26 NOTE — ED Triage Notes (Addendum)
Patient states she woke this AM with left facial numbness and feeling "lightheaded." Patient denies any blurred vision. Patient reports history of 2 strokes.

## 2021-11-26 NOTE — ED Provider Notes (Signed)
Signout from Dr. Posey Rea.  61 year old female history of prior stroke here with new left-sided facial numbness.  CT and MRI negative.  Awaiting neurology consult.  Disposition per neurology recommendations Physical Exam  BP (!) 174/89    Pulse 79    Temp 98 F (36.7 C) (Oral)    Resp 18    Ht 5' (1.524 m)    Wt 79.8 kg    SpO2 96%    BMI 34.37 kg/m   Physical Exam  Procedures  Procedures  ED Course / MDM    Medical Decision Making Received a secure message from Dr. Derry Lory. He felt that this did not represent an acute stroke but just a recrudescence.  Recommended outpatient follow-up with her neurologist.  I reviewed this with the patient and she is comfortable plan for discharge and outpatient neurology follow-up.        Terrilee Files, MD 11/27/21 1055

## 2021-11-26 NOTE — Discharge Instructions (Signed)
You were seen in the emergency department for left facial numbness.  You had an MRI that did not show any obvious stroke.  Neurology is recommending that you follow-up with your outpatient neurologist.  Please continue your regular medications.  Return if any worsening or concerning symptoms

## 2021-11-26 NOTE — ED Notes (Signed)
Patient is in MRI due to vitals

## 2021-11-26 NOTE — ED Notes (Signed)
Patient transported to CT 

## 2021-11-26 NOTE — ED Notes (Signed)
Code stroke cancelled 

## 2021-11-27 ENCOUNTER — Ambulatory Visit (HOSPITAL_COMMUNITY)
Admission: RE | Admit: 2021-11-27 | Payer: Commercial Managed Care - HMO | Source: Ambulatory Visit | Admitting: Oral Surgery

## 2021-11-27 ENCOUNTER — Encounter (HOSPITAL_COMMUNITY): Admission: RE | Payer: Self-pay | Source: Ambulatory Visit

## 2021-11-27 SURGERY — DENTAL RESTORATION/EXTRACTIONS
Anesthesia: General

## 2021-12-25 ENCOUNTER — Emergency Department (HOSPITAL_COMMUNITY)
Admission: EM | Admit: 2021-12-25 | Discharge: 2021-12-25 | Disposition: A | Discharge status: Home / Self Care | Payer: Medicare Other | Attending: Student | Admitting: Student

## 2021-12-25 ENCOUNTER — Encounter (HOSPITAL_COMMUNITY): Payer: Self-pay

## 2021-12-25 ENCOUNTER — Other Ambulatory Visit: Payer: Self-pay

## 2021-12-25 DIAGNOSIS — F17210 Nicotine dependence, cigarettes, uncomplicated: Secondary | ICD-10-CM | POA: Diagnosis not present

## 2021-12-25 DIAGNOSIS — Z79899 Other long term (current) drug therapy: Secondary | ICD-10-CM | POA: Diagnosis not present

## 2021-12-25 DIAGNOSIS — R220 Localized swelling, mass and lump, head: Secondary | ICD-10-CM | POA: Diagnosis present

## 2021-12-25 DIAGNOSIS — K029 Dental caries, unspecified: Secondary | ICD-10-CM | POA: Diagnosis not present

## 2021-12-25 DIAGNOSIS — I10 Essential (primary) hypertension: Secondary | ICD-10-CM | POA: Diagnosis not present

## 2021-12-25 DIAGNOSIS — H6122 Impacted cerumen, left ear: Secondary | ICD-10-CM | POA: Insufficient documentation

## 2021-12-25 DIAGNOSIS — K047 Periapical abscess without sinus: Secondary | ICD-10-CM

## 2021-12-25 DIAGNOSIS — K0889 Other specified disorders of teeth and supporting structures: Secondary | ICD-10-CM

## 2021-12-25 MED ORDER — AMOXICILLIN-POT CLAVULANATE 875-125 MG PO TABS: 1.0000 | ORAL_TABLET | Freq: Two times a day (BID) | ORAL | 0 refills | Status: DC

## 2021-12-25 MED ORDER — NAPROXEN 375 MG PO TABS: 375.0000 mg | ORAL_TABLET | Freq: Two times a day (BID) | ORAL | 0 refills | Status: DC

## 2021-12-25 NOTE — ED Notes (Signed)
I provided reinforced discharge education based off of discharge instructions. Pt acknowledged and understood my education. Pt had no further questions/concerns for provider/myself.  °

## 2021-12-25 NOTE — ED Provider Triage Note (Signed)
Emergency Medicine Provider Triage Evaluation Note  Sheryl Brown , Brown 61 y.o. female  was evaluated in triage.  Pt complains of left lower facial swelling onset 3 days.  Patient has Brown dental appointment on March 6.  Has associated left lower dental pain.  Has tried heating pad and ibuprofen with no relief or any symptoms.  Denies fever, chills, trouble swallowing, painful swallowing.   Review of Systems  Positive: As per HPI above Negative: Fever, chills, trouble swallowing  Physical Exam  BP (!) 157/98 (BP Location: Left Arm)    Pulse (!) 101    Temp 97.6 F (36.4 C) (Oral)    Resp 18    Ht 5' (1.524 m)    Wt 79.8 kg    SpO2 99%    BMI 34.37 kg/m  Gen:   Awake, no distress   Resp:  Normal effort  MSK:   Moves extremities without difficulty  Other:  Tenderness to palpation noted to left lower gumline and left upper gumline.  Abnormal dentition noted throughout.  Medical Decision Making  Medically screening exam initiated at 8:08 AM.  Appropriate orders placed.  Sheryl Brown was informed that the remainder of the evaluation will be completed by another provider, this initial triage assessment does not replace that evaluation, and the importance of remaining in the ED until their evaluation is complete.    Future Sheryl A, PA-C 12/25/21 [phone]

## 2021-12-25 NOTE — ED Triage Notes (Addendum)
Patient c/o left lower dental pain and facial swelling x 2 days. Patient states she has an appointment with her dentist on 01/14/22. Patient denies any swallowing or breathing issues, but pain worsens with chewing.  Patient also c/o pain in the left upper gum as well.

## 2021-12-25 NOTE — Discharge Instructions (Addendum)
Please use a solution of half hydrogen peroxide, half water, place 4 drops in your ear with your ear up towards the sky.  Please stay in this position for 15 minutes and then flush with water to remove earwax from left ear.  Please see your dentist as soon as possible.  Your swelling is likely due to a necrotic tooth that will need to be removed.  It may be prudent to remove this tooth sooner rather than later and this is at the discretion of your dentist.

## 2021-12-25 NOTE — ED Provider Notes (Signed)
[facility]-EMERGENCY DEPT Provider Note  CSN: [id number] Arrival date & time: 12/25/21 9476  Chief Complaint(s) Facial Swelling and Dental Pain  HPI Sheryl Brown is a 61 y.o. female who presents emergency department for evaluation of facial swelling and dental pain.  Patient endorsing pain to tooth #19 with a large dental carry and approximately 48 hours of worsening left mandibular swelling.  She has no trismus or difficulty swallowing.  No shortness of breath, fever, chest pain, nausea, vomiting or other systemic symptoms.  Patient has an appointment with her dentist for tooth removal in early March.  No submental swelling.   Dental Pain Associated symptoms: facial swelling    Past Medical History Past Medical History:  Diagnosis Date   Meningitis 2013   Neuropathy    Seasonal allergies    Seizures (HCC)    Stroke [facility])    Patient Active Problem List   Diagnosis Date Noted   Vitamin D deficiency 09/01/2019   Seizure (HCC) 01/18/2019   TIA (transient ischemic attack) 01/18/2019   HTN (hypertension) 01/28/2018   Hyperlipidemia 06/03/2017   Chronic bilateral low back pain without sciatica 12/12/2016   URI 01/20/2009   TOBACCO ABUSE 12/08/2008   CANDIDIASIS, VAGINAL 10/21/2008   DEPRESSION, SITUATIONAL 05/31/2008   DENTAL PAIN 02/23/2008   ACUTE GINGIVITIS NONPLAQUE INDUCED 12/24/2007   INGUINAL PAIN, RIGHT 12/24/2007   UTI 11/26/2007   ROTATOR CUFF SYNDROME, RIGHT 07/24/2007   GERD 07/23/2007   ELEVATED BLOOD PRESSURE WITHOUT DIAGNOSIS OF HYPERTENSION 05/28/2007   SYMPTOM, PAIN, ABDOMINAL, GENERALIZED 11/25/2006   LABYRINTHITIS, HX OF 09/11/2006   Home Medication(s) Prior to Admission medications   Medication Sig Start Date End Date Taking? Authorizing Provider  amitriptyline (ELAVIL) 25 MG tablet Take 0.5 tablets (12.5 mg total) by mouth at bedtime. 08/06/21   Windell Norfolk, MD  amLODipine (NORVASC) 5 MG tablet TAKE 1 TABLET(5 MG) BY MOUTH  DAILY 09/12/21   Grayce Sessions, NP  atorvastatin (LIPITOR) 20 MG tablet Take 1 tablet (20 mg total) by mouth daily. 07/22/21   Grayce Sessions, NP  gabapentin (NEURONTIN) 400 MG capsule Take 1 capsule (400 mg total) by mouth 3 (three) times daily. Patient not taking: Reported on 11/20/2021 06/11/21 07/11/21  Grayce Sessions, NP  lamoTRIgine (LAMICTAL) 100 MG tablet Take 1 tablet (100 mg total) by mouth daily. 06/12/21 06/12/22  Grayce Sessions, NP  omeprazole (PRILOSEC) 40 MG capsule Take 40 mg by mouth daily. 06/29/20   [provider]  PARoxetine (PAXIL) 20 MG tablet Take 20 mg by mouth daily. 10/03/21   [provider]  valsartan (DIOVAN) 160 MG tablet TAKE 1 TABLET(160 MG) BY MOUTH DAILY 09/12/21   Grayce Sessions, NP                                                                                                                                    Past Surgical History  Past Surgical History:  Procedure Laterality Date   CESAREAN SECTION     Family History Family History  Problem Relation Age of Onset   Heart failure Mother    Cirrhosis Father     Social History Social History   Tobacco Use   Smoking status: Every Day    Packs/day: 0.25    Types: Cigarettes   Smokeless tobacco: Never  Vaping Use   Vaping Use: Never used  Substance Use Topics   Alcohol use: Yes    Comment: only on holidays   Drug use: Never   Allergies Gabapentin  Review of Systems Review of Systems  HENT:  Positive for dental problem and facial swelling.    Physical Exam Vital Signs  I have reviewed the triage vital signs BP (!) 157/98 (BP Location: Left Arm)    Pulse (!) 101    Temp 97.6 F (36.4 C) (Oral)    Resp 18    Ht 5' (1.524 m)    Wt 79.8 kg    SpO2 99%    BMI 34.37 kg/m   Physical Exam Vitals and nursing note reviewed.  Constitutional:      General: She is not in acute distress.    Appearance: She is well-developed.  HENT:     Head: Normocephalic and  atraumatic.     Comments: Necrotic dental carry at tooth #19, mild swelling at the angle of the mandible on the left, no periapical abscess visible Eyes:     Conjunctiva/sclera: Conjunctivae normal.  Cardiovascular:     Rate and Rhythm: Normal rate and regular rhythm.     Heart sounds: No murmur heard. Pulmonary:     Effort: Pulmonary effort is normal. No respiratory distress.     Breath sounds: Normal breath sounds.  Abdominal:     Palpations: Abdomen is soft.     Tenderness: There is no abdominal tenderness.  Musculoskeletal:        General: No swelling.     Cervical back: Neck supple.  Skin:    General: Skin is warm and dry.     Capillary Refill: Capillary refill takes less than 2 seconds.  Neurological:     Mental Status: She is alert.  Psychiatric:        Mood and Affect: Mood normal.    ED Results and Treatments Labs (all labs ordered are listed, but only abnormal results are displayed) Labs Reviewed - No data to display                                                                                                                        Radiology No results found.  Pertinent labs & imaging results that were available during my care of the patient were reviewed by me and considered in my medical decision making (see MDM for details).  Medications Ordered in ED Medications - No data to display  Procedures Procedures  (including critical care time)  Medical Decision Making / ED Course   This patient presents to the ED for concern of dental pain and facial swelling, this involves an extensive number of treatment options, and is a complaint that carries with it a high risk of complications and morbidity.  The differential diagnosis includes pulpitis, dental carry, periapical abscess, submandibular abscess, Ludewig's angina  MDM: Patient  seen the emergency department for evaluation of dental pain and swelling.  Physical exam with a necrotic tooth #19 and very mild swelling to the angle of the mandible on the left.  No trismus or oropharyngeal swelling.  No submental swelling.  Low suspicion for Ludwick's angina.  Of note, patient also has complete cerumen impaction on the left and she declined ear cleaning here in the emergency department, she was provided instructions on how to remove this impaction at home with hydrogen peroxide, saline and bulb suction.  She was counseled not to use Q-tips anymore.  She was prescribed Augmentin and naproxen and instructed to follow-up with her dentist.   Additional history obtained:  -External records from outside source obtained and reviewed including: Chart review including previous notes, labs, imaging, consultation notes   Lab Tests: -I ordered, reviewed, and interpreted labs.   The pertinent results include:   Labs Reviewed - No data to display    Medicines ordered and prescription drug management: No orders of the defined types were placed in this encounter.   -I have reviewed the patients home medicines and have made adjustments as needed  Critical interventions none   Social Determinants of Health:  Factors impacting patients care include: poor dental hygiene, abuse (extensive tobacco cessation counseling provided)   Reevaluation: After the interventions noted above, I reevaluated the patient and found that they have :stayed the same  Co morbidities that complicate the patient evaluation  Past Medical History:  Diagnosis Date   Meningitis 2013   Neuropathy    Seasonal allergies    Seizures (HCC)    Stroke (HCC)       Dispostion: I considered admission for this patient, but she is able to tolerate p.o. without difficulty and her dental pain does not meet admission criteria at this time.  Safe for outpatient dental follow-up.     Final Clinical Impression(s) /  ED Diagnoses Final diagnoses:  None     @PCDICTATION @    , MD 12/25/21 [phone]

## 2022-01-15 ENCOUNTER — Other Ambulatory Visit (INDEPENDENT_AMBULATORY_CARE_PROVIDER_SITE_OTHER): Payer: Self-pay | Admitting: Primary Care

## 2022-01-15 DIAGNOSIS — E782 Mixed hyperlipidemia: Secondary | ICD-10-CM

## 2022-05-09 ENCOUNTER — Other Ambulatory Visit: Payer: Self-pay

## 2022-05-09 ENCOUNTER — Emergency Department (HOSPITAL_COMMUNITY): Payer: Medicare Other

## 2022-05-09 ENCOUNTER — Emergency Department (HOSPITAL_COMMUNITY)
Admission: EM | Admit: 2022-05-09 | Discharge: 2022-05-09 | Disposition: A | Discharge status: Home / Self Care | Payer: Medicare Other | Attending: Emergency Medicine | Admitting: Emergency Medicine

## 2022-05-09 DIAGNOSIS — D72829 Elevated white blood cell count, unspecified: Secondary | ICD-10-CM | POA: Diagnosis not present

## 2022-05-09 DIAGNOSIS — I10 Essential (primary) hypertension: Secondary | ICD-10-CM | POA: Diagnosis not present

## 2022-05-09 DIAGNOSIS — R079 Chest pain, unspecified: Secondary | ICD-10-CM | POA: Insufficient documentation

## 2022-05-09 DIAGNOSIS — Z79899 Other long term (current) drug therapy: Secondary | ICD-10-CM | POA: Diagnosis not present

## 2022-05-09 DIAGNOSIS — R202 Paresthesia of skin: Secondary | ICD-10-CM | POA: Insufficient documentation

## 2022-05-09 LAB — CBC
HCT: 38.9 % (ref 36.0–46.0)
Hemoglobin: 12.8 g/dL (ref 12.0–15.0)
MCH: 30.5 pg (ref 26.0–34.0)
MCHC: 32.9 g/dL (ref 30.0–36.0)
MCV: 92.6 fL (ref 80.0–100.0)
Platelets: 225 10*3/uL (ref 150–400)
RBC: 4.2 MIL/uL (ref 3.87–5.11)
RDW: 12.8 % (ref 11.5–15.5)
WBC: 10.7 10*3/uL — ABNORMAL HIGH (ref 4.0–10.5)
nRBC: 0 % (ref 0.0–0.2)

## 2022-05-09 LAB — DIFFERENTIAL
Abs Immature Granulocytes: 0.03 10*3/uL (ref 0.00–0.07)
Basophils Absolute: 0.1 10*3/uL (ref 0.0–0.1)
Basophils Relative: 1 %
Eosinophils Absolute: 0.1 10*3/uL (ref 0.0–0.5)
Eosinophils Relative: 1 %
Immature Granulocytes: 0 %
Lymphocytes Relative: 40 %
Lymphs Abs: 4.3 10*3/uL — ABNORMAL HIGH (ref 0.7–4.0)
Monocytes Absolute: 0.8 10*3/uL (ref 0.1–1.0)
Monocytes Relative: 8 %
Neutro Abs: 5.4 10*3/uL (ref 1.7–7.7)
Neutrophils Relative %: 50 %

## 2022-05-09 LAB — COMPREHENSIVE METABOLIC PANEL
ALT: 23 U/L (ref 0–44)
AST: 23 U/L (ref 15–41)
Albumin: 3.8 g/dL (ref 3.5–5.0)
Alkaline Phosphatase: 60 U/L (ref 38–126)
Anion gap: 8 (ref 5–15)
BUN: 20 mg/dL (ref 8–23)
CO2: 23 mmol/L (ref 22–32)
Calcium: 9.4 mg/dL (ref 8.9–10.3)
Chloride: 110 mmol/L (ref 98–111)
Creatinine, Ser: 0.87 mg/dL (ref 0.44–1.00)
GFR, Estimated: >60 mL/min (ref >60)
Glucose, Bld: 95 mg/dL (ref 70–99)
Potassium: 4.4 mmol/L (ref 3.5–5.1)
Sodium: 141 mmol/L (ref 135–145)
Total Bilirubin: 0.5 mg/dL (ref 0.3–1.2)
Total Protein: 7.7 g/dL (ref 6.5–8.1)

## 2022-05-09 LAB — PROTIME-INR
INR: 0.9 (ref 0.8–1.2)
Prothrombin Time: 11.9 seconds (ref 11.4–15.2)

## 2022-05-09 LAB — ETHANOL: Alcohol, Ethyl (B): <10 mg/dL (ref <10)

## 2022-05-09 LAB — APTT: aPTT: 23 seconds — ABNORMAL LOW (ref 24–36)

## 2022-05-09 MED ORDER — SODIUM CHLORIDE 0.9 % IV SOLN: 2.0000 g | Freq: Once | INTRAVENOUS | Status: DC

## 2022-05-09 MED ORDER — ACETAMINOPHEN 325 MG PO TABS: 650.0000 mg | ORAL_TABLET | Freq: Once | ORAL | Status: DC

## 2022-05-09 MED ORDER — PROCHLORPERAZINE EDISYLATE 10 MG/2ML IJ SOLN
10.0000 mg | Freq: Once | INTRAMUSCULAR | Status: AC
  Administered 2022-05-09: 10 mg via INTRAVENOUS
  Filled 2022-05-09: qty 2

## 2022-05-09 NOTE — ED Triage Notes (Signed)
Patient coming to ED for evaluation of R leg "feeling like pins and needles," R arm "feels like someone is squeezing it," and chest tightness.  Symptoms started 15 minutes prior to arrival while laying in bed.  Hx of stroke

## 2022-05-09 NOTE — ED Provider Notes (Signed)
[facility]-EMERGENCY DEPT Provider Note  CSN: [id number] Arrival date & time: 05/09/22 0001  Chief Complaint(s) Tingling and Leg Pain  HPI Sheryl Brown is a 61 y.o. female with a past medical history listed below including meningitis, partial seizures, reported history of stroke (on review of records, patient has had numerous MRIs performed at outside hospitals that did not show any evidence of stroke -last of which was March 2020) .  She presents today for right-sided upper and lower extremity numbness that began after sharp shooting pain to the right thigh just prior to arrival around midnight.  Patient denies any focal weakness, visual disturbance, ataxia, or aphasia.  She reported chest pain but reports this pain has been ongoing for several years.  It is right upper chest.  She attributes it to her right rotator cuff injury.  No acute changes tonight.  No associated shortness of breath.  No nausea or vomiting.  No recent fevers or infections.  No coughing or congestion.   Patient reports that she has had increased stress because she is moving out of her current home.  Additionally, on further record review, patient has presented to outside hospitals in the past for similar presentations and worked up for stroke which have been negative.  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  The history is provided by the patient.    Past Medical History Past Medical History:  Diagnosis Date   Meningitis 2013   Neuropathy    Seasonal allergies    Seizures (HCC)    Stroke [facility])    Patient Active Problem List   Diagnosis Date Noted   Vitamin D deficiency 09/01/2019   Seizure (HCC) 01/18/2019   TIA (transient ischemic attack) 01/18/2019   HTN (hypertension) 01/28/2018   Hyperlipidemia 06/03/2017   Chronic bilateral low back pain without sciatica 12/12/2016   URI 01/20/2009   TOBACCO ABUSE 12/08/2008   CANDIDIASIS,  VAGINAL 10/21/2008   DEPRESSION, SITUATIONAL 05/31/2008   DENTAL PAIN 02/23/2008   ACUTE GINGIVITIS NONPLAQUE INDUCED 12/24/2007   INGUINAL PAIN, RIGHT 12/24/2007   UTI 11/26/2007   ROTATOR CUFF SYNDROME, RIGHT 07/24/2007   GERD 07/23/2007   ELEVATED BLOOD PRESSURE WITHOUT DIAGNOSIS OF HYPERTENSION 05/28/2007   SYMPTOM, PAIN, ABDOMINAL, GENERALIZED 11/25/2006   LABYRINTHITIS, HX OF 09/11/2006   Home Medication(s) Prior to Admission medications   Medication Sig Start Date End Date Taking? Authorizing Provider  amitriptyline (ELAVIL) 25 MG tablet Take 0.5 tablets (12.5 mg total) by mouth at bedtime. Patient not taking: Reported on 05/09/2022 08/06/21   Windell Norfolk, MD  amitriptyline (ELAVIL) 50 MG tablet Take 50 mg by mouth at bedtime. 01/11/22   [provider]  amLODipine (NORVASC) 5 MG tablet TAKE 1 TABLET(5 MG) BY MOUTH DAILY Patient taking differently: Take 5 mg by mouth daily. 09/12/21   Grayce Sessions, NP  amoxicillin-clavulanate (AUGMENTIN) 875-125 MG tablet Take 1 tablet by mouth every 12 (twelve) hours. Patient not taking: Reported on 05/09/2022 12/25/21   Kommor, Wyn Forster, MD  atorvastatin (LIPITOR) 20 MG tablet Take 1 tablet (20 mg total) by mouth daily. 07/22/21   Grayce Sessions, NP  gabapentin (NEURONTIN) 400 MG capsule Take 1 capsule (400 mg total) by mouth 3 (three) times daily. Patient not taking: Reported on 11/20/2021 06/11/21 07/11/21  Grayce Sessions, NP  lamoTRIgine (LAMICTAL) 100 MG tablet Take 1 tablet (100 mg total) by mouth daily. Patient not taking: Reported on 05/09/2022 06/12/21 06/12/22  Grayce Sessions, NP  naproxen (NAPROSYN) 375 MG tablet Take 1 tablet (375 mg total) by mouth 2 (two) times daily. 12/25/21   Kommor, Madison, MD  omeprazole (PRILOSEC) 40 MG capsule Take 40 mg by mouth daily. 06/29/20   [provider]  PARoxetine (PAXIL) 20 MG tablet Take 20 mg by mouth daily. 10/03/21   [provider]  valsartan (DIOVAN) 160 MG  tablet TAKE 1 TABLET(160 MG) BY MOUTH DAILY Patient taking differently: Take 160 mg by mouth 2 (two) times daily. 09/12/21   Grayce SessionsEdwards, Michelle P, NP                                                                                                                                    Allergies Gabapentin  Review of Systems Review of Systems As noted in HPI  Physical Exam Vital Signs  I have reviewed the triage vital signs BP (!) 144/104   Pulse 81   Temp 98.4 F (36.9 C) (Oral)   Resp 16   Ht 5\' 3"  (1.6 m)   SpO2 95%   BMI 31.18 kg/m   Physical Exam Vitals reviewed.  Constitutional:      General: She is not in acute distress.    Appearance: She is well-developed. She is not diaphoretic.  HENT:     Head: Normocephalic and atraumatic.     Nose: Nose normal.  Eyes:     General: No scleral icterus.       Right eye: No discharge.        Left eye: No discharge.     Conjunctiva/sclera: Conjunctivae normal.     Pupils: Pupils are equal, round, and reactive to light.  Cardiovascular:     Rate and Rhythm: Normal rate and regular rhythm.     Heart sounds: No murmur heard.    No friction rub. No gallop.  Pulmonary:     Effort: Pulmonary effort is normal. No respiratory distress.     Breath sounds: Normal breath sounds. No stridor. No rales.  Abdominal:     General: There is no distension.     Palpations: Abdomen is soft.     Tenderness: There is no abdominal tenderness.  Musculoskeletal:        General: No tenderness.     Cervical back: Normal range of motion and neck supple.  Skin:    General: Skin is warm and dry.     Findings: No erythema or rash.  Neurological:     Mental Status: She is alert and oriented to person, place, and time.     Comments: Mental Status:  Alert and oriented to person, place, and time.  Attention and concentration normal.  Speech clear.  Recent memory is intact  Cranial Nerves:  II Visual Fields: Intact to confrontation. Visual fields  intact. III, IV, VI: Pupils equal and reactive to light and near. Full eye movement without nystagmus  V Facial Sensation: Normal. No weakness of  masticatory muscles  VII: No facial weakness or asymmetry  VIII Auditory Acuity: Grossly normal  IX/X: The uvula is midline; the palate elevates symmetrically  XI: Normal sternocleidomastoid and trapezius strength  XII: The tongue is midline. No atrophy or fasciculations.  Motor System: Muscle Strength: 5/5 and symmetric in the upper and lower extremities. Effort dependent exam with rapid giveaway to bilateral lower extremity. Muscle Tone: Tone and muscle bulk are normal in the upper and lower extremities.  Coordination: No tremor.  Sensation: diminished to right C5/C6 distribution and entire RLE. Gait: deferred       ED Results and Treatments Labs (all labs ordered are listed, but only abnormal results are displayed) Labs Reviewed  APTT - Abnormal; Notable for the following components:      Result Value   aPTT 23 (*)    All other components within normal limits  CBC - Abnormal; Notable for the following components:   WBC 10.7 (*)    All other components within normal limits  DIFFERENTIAL - Abnormal; Notable for the following components:   Lymphs Abs 4.3 (*)    All other components within normal limits  PROTIME-INR  COMPREHENSIVE METABOLIC PANEL  ETHANOL                                                                                                                         EKG  EKG Interpretation  Date/Time:    Ventricular Rate:    PR Interval:    QRS Duration:   QT Interval:    QTC Calculation:   R Axis:     Text Interpretation:         Radiology MR BRAIN WO CONTRAST  Result Date: 05/09/2022 CLINICAL DATA:  61 year old female with right extremity numbness, "Pins and needles". Chest tightness. EXAM: MRI HEAD WITHOUT CONTRAST TECHNIQUE: Multiplanar, multiecho pulse sequences of the brain and surrounding structures were  obtained without intravenous contrast. COMPARISON:  Head CT 0307 hours today. Brain MRI 11/26/2021 and earlier. FINDINGS: Brain: Cerebral volume is stable and within normal limits for age. No restricted diffusion to suggest acute infarction. No midline shift, mass effect, evidence of mass lesion, ventriculomegaly, extra-axial collection or acute intracranial hemorrhage. Cervicomedullary junction and pituitary are within normal limits. Vascular: Wallace Cullens and white matter signal throughout the brain appears stable from last year and normal. No encephalomalacia or chronic cerebral blood products are evident. Skull and upper cervical spine: Negative for age visible cervical spine. Visualized bone marrow signal is within normal limits. Sinuses/Orbits: Negative orbits. Mild sphenoid sinus mucosal thickening has not significantly changed from last year. There is new maxillary dental hardware artifact since 2022. Other: Mastoid air cells remain clear. Visible scalp and face appear negative. IMPRESSION: Stable and normal noncontrast MRI appearance of the brain. Electronically Signed   By: Odessa Fleming M.D.   On: 05/09/2022 06:42   CT Head Wo Contrast  Result Date: 05/09/2022 CLINICAL DATA:  Numbness in the right leg. EXAM: CT HEAD WITHOUT CONTRAST  TECHNIQUE: Contiguous axial images were obtained from the base of the skull through the vertex without intravenous contrast. RADIATION DOSE REDUCTION: This exam was performed according to the departmental dose-optimization program which includes automated exposure control, adjustment of the mA and/or kV according to patient size and/or use of iterative reconstruction technique. COMPARISON:  CT head 11/26/2021.  MRI brain 11/26/2021 FINDINGS: Brain: No evidence of acute infarction, hemorrhage, hydrocephalus, extra-axial collection or mass lesion/mass effect. Vascular: No hyperdense vessel or unexpected calcification. Skull: Normal. Negative for fracture or focal lesion. Sinuses/Orbits:  No acute finding. Other: None. IMPRESSION: No acute intracranial abnormalities. Electronically Signed   By: Burman Nieves M.D.   On: 05/09/2022 03:28    Pertinent labs & imaging results that were available during my care of the patient were reviewed by me and considered in my medical decision making (see MDM for details).  Medications Ordered in ED Medications  prochlorperazine (COMPAZINE) injection 10 mg (10 mg Intravenous Given 05/09/22 0414)                                                                                                                                     Procedures Procedures  (including critical care time)  Medical Decision Making / ED Course    Complexity of Problem:  Co-morbidities/SDOH that complicate the patient evaluation/care: Noted in HPI  Additional history obtained: Record review noted above  Patient's presenting problem/concern, DDX, and MDM listed below: Right upper or lower extremity numbness No obvious weakness.  Exam was limited due to patient's right rotator cuff injury and effort dependence to bilateral lower extremities. Given patient's prior history with negative MRIs, I feel that CVA is less likely.  I will however obtain an MRI to rule it out. Most likely partial seizures versus complex migraine. We will assess for any electrolyte derangements    Complexity of Data:    Laboratory Tests ordered listed below with my independent interpretation: CBC with mild leukocytosis.  No anemia Metabolic panel without significant electrolyte derangements or renal sufficiency. Alcohol negative.   Imaging Studies ordered listed below with my independent interpretation: CT head negative. MRI negative for acute findings Confirmed by radiology     ED Course:    Assessment, Add'l Intervention, and Reassessment: Right sided numbness Work up reassuring Recommended close neurology follow up    Final Clinical Impression(s) / ED Diagnoses Final  diagnoses:  Paresthesia   The patient appears reasonably screened and/or stabilized for discharge and I doubt any other medical condition or other [facility] requiring further screening, evaluation, or treatment in the ED at this time prior to discharge. Safe for discharge with strict return precautions.  Disposition: Discharge  Condition: Good  I have discussed the results, Dx and Tx plan with the patient/family who expressed understanding and agree(s) with the plan. Discharge instructions discussed at length. The patient/family was given strict return precautions who verbalized understanding of the instructions. No further questions  at time of discharge.    ED Discharge Orders     None        Follow Up: Romie Levee, MD [address] [place] [address] [phone]  Call  to schedule an appointment for close follow up  Stevphen Rochester, MD [address] [place] [address] [phone]  Call  to schedule an appointment for close follow up           This chart was dictated using voice recognition software.  Despite best efforts to proofread,  errors can occur which can change the documentation meaning.    Nira Conn, MD 05/09/22 [phone]

## 2022-05-09 NOTE — ED Notes (Signed)
Rounded on pt and came to give pt her compazine. Pt is asleep and not complaining of pain.

## 2022-05-15 ENCOUNTER — Other Ambulatory Visit: Payer: Self-pay

## 2022-05-15 ENCOUNTER — Encounter (HOSPITAL_COMMUNITY): Payer: Self-pay

## 2022-05-15 ENCOUNTER — Emergency Department (HOSPITAL_COMMUNITY)
Admission: EM | Admit: 2022-05-15 | Discharge: 2022-05-15 | Disposition: A | Discharge status: Home / Self Care | Payer: Medicare Other | Attending: Emergency Medicine | Admitting: Emergency Medicine

## 2022-05-15 ENCOUNTER — Telehealth (HOSPITAL_COMMUNITY): Payer: Self-pay | Admitting: Professional

## 2022-05-15 DIAGNOSIS — Z20822 Contact with and (suspected) exposure to covid-19: Secondary | ICD-10-CM | POA: Insufficient documentation

## 2022-05-15 DIAGNOSIS — R45850 Homicidal ideations: Secondary | ICD-10-CM | POA: Insufficient documentation

## 2022-05-15 DIAGNOSIS — R45851 Suicidal ideations: Secondary | ICD-10-CM | POA: Insufficient documentation

## 2022-05-15 DIAGNOSIS — F332 Major depressive disorder, recurrent severe without psychotic features: Secondary | ICD-10-CM | POA: Insufficient documentation

## 2022-05-15 DIAGNOSIS — R440 Auditory hallucinations: Secondary | ICD-10-CM | POA: Insufficient documentation

## 2022-05-15 DIAGNOSIS — R451 Restlessness and agitation: Secondary | ICD-10-CM | POA: Insufficient documentation

## 2022-05-15 DIAGNOSIS — Z046 Encounter for general psychiatric examination, requested by authority: Secondary | ICD-10-CM | POA: Diagnosis not present

## 2022-05-15 LAB — COMPREHENSIVE METABOLIC PANEL
ALT: 20 U/L (ref 0–44)
AST: 23 U/L (ref 15–41)
Albumin: 3.6 g/dL (ref 3.5–5.0)
Alkaline Phosphatase: 61 U/L (ref 38–126)
Anion gap: 13 (ref 5–15)
BUN: 15 mg/dL (ref 8–23)
CO2: 20 mmol/L — ABNORMAL LOW (ref 22–32)
Calcium: 9 mg/dL (ref 8.9–10.3)
Chloride: 111 mmol/L (ref 98–111)
Creatinine, Ser: 0.81 mg/dL (ref 0.44–1.00)
GFR, Estimated: >60 mL/min (ref >60)
Glucose, Bld: 96 mg/dL (ref 70–99)
Potassium: 4.1 mmol/L (ref 3.5–5.1)
Sodium: 144 mmol/L (ref 135–145)
Total Bilirubin: 0.4 mg/dL (ref 0.3–1.2)
Total Protein: 7 g/dL (ref 6.5–8.1)

## 2022-05-15 LAB — CBC WITH DIFFERENTIAL/PLATELET
Abs Immature Granulocytes: 0.02 10*3/uL (ref 0.00–0.07)
Basophils Absolute: 0 10*3/uL (ref 0.0–0.1)
Basophils Relative: 0 %
Eosinophils Absolute: 0.2 10*3/uL (ref 0.0–0.5)
Eosinophils Relative: 2 %
HCT: 38.8 % (ref 36.0–46.0)
Hemoglobin: 12.3 g/dL (ref 12.0–15.0)
Immature Granulocytes: 0 %
Lymphocytes Relative: 43 %
Lymphs Abs: 4.2 10*3/uL — ABNORMAL HIGH (ref 0.7–4.0)
MCH: 29.8 pg (ref 26.0–34.0)
MCHC: 31.7 g/dL (ref 30.0–36.0)
MCV: 93.9 fL (ref 80.0–100.0)
Monocytes Absolute: 0.6 10*3/uL (ref 0.1–1.0)
Monocytes Relative: 6 %
Neutro Abs: 4.8 10*3/uL (ref 1.7–7.7)
Neutrophils Relative %: 49 %
Platelets: 228 10*3/uL (ref 150–400)
RBC: 4.13 MIL/uL (ref 3.87–5.11)
RDW: 13.1 % (ref 11.5–15.5)
WBC: 9.8 10*3/uL (ref 4.0–10.5)
nRBC: 0 % (ref 0.0–0.2)

## 2022-05-15 LAB — RESP PANEL BY RT-PCR (FLU A&B, COVID) ARPGX2
Influenza A by PCR: NEGATIVE
Influenza B by PCR: NEGATIVE
SARS Coronavirus 2 by RT PCR: NEGATIVE

## 2022-05-15 LAB — ETHANOL: Alcohol, Ethyl (B): 199 mg/dL — ABNORMAL HIGH (ref <10)

## 2022-05-15 LAB — ACETAMINOPHEN LEVEL: Acetaminophen (Tylenol), Serum: <10 ug/mL — ABNORMAL LOW (ref 10–30)

## 2022-05-15 LAB — SALICYLATE LEVEL: Salicylate Lvl: <7 mg/dL — ABNORMAL LOW (ref 7.0–30.0)

## 2022-05-15 MED ORDER — STERILE WATER FOR INJECTION IJ SOLN
INTRAMUSCULAR | Status: AC
  Filled 2022-05-15: qty 10

## 2022-05-15 MED ORDER — ZIPRASIDONE MESYLATE 20 MG IM SOLR
20.0000 mg | Freq: Once | INTRAMUSCULAR | Status: AC
  Administered 2022-05-15: 20 mg via INTRAMUSCULAR
  Filled 2022-05-15: qty 20

## 2022-05-15 MED ORDER — MIDAZOLAM HCL (PF) 5 MG/ML IJ SOLN: 5.0000 mg | Freq: Once | INTRAMUSCULAR | Status: DC

## 2022-05-15 NOTE — BH Assessment (Addendum)
@  3559, Clinician attempted to completed patient's TTS assessment. Followed up with patient's nurse to inquire if patient is able to participate in the TTS assessment.  Awaiting response from nursing.

## 2022-05-15 NOTE — Discharge Instructions (Addendum)
Schizophrenia/Mental Health Resources ?National Suicide Prevention Lifeline ?1-800-273-TALK (8255) ?http://www.suicidepreventionlifeline.org/ ??988?-Mental Health Crisis Line ? ?National Schizophrenia Foundation ?https://sczaction.org/ ?National Institute of Mental Health ?1-866-615-6464 ?nimhinfo@nih.gov (e-mail) ?www.nimh.nih.gov ?Schizophrenia & Psychosis Action Alliance ?800-493-2094 ?info@sczaction.org ?https://sczaction.org/  ?5 Additional Healthline identified ?Best online Schizophrenia Support Groups? ?Students with Psychosis ? ?Schizophrenia Spectrum Support ? ?Supportiv ($30 monthly fee)  ? ?NAMI Connection Recovery Support Group ? ?Schizophrenia Alliance ? ?Local Resources: ?Outpatient:  ? ?[facility]:  ?([phone] ?[address]   [place], [place]  [address] ?  ? ? ?https://www.[place].gov/services/[place]-[facility]-centers#contact ? ? ?[place]  ?[facility] ?[address] ?[place], [place] [address] ?[phone] ? ?[facility]  ?[facility] ?[address] ?[place], [place]  [address] ?[phone] ?Local Resources ?(Inpatient) ? ?[facility] [facility] ?[address]  ?[place], [place] [address] ?[phone] ? ?[facility] ?Behavioral Medicine Unit and Geriatric Psychiatric Unit   ?[address]  ?[place], [place] [address] ?[phone] ? ? ?[facility]MI [facility] ?https://naminwpiedmontnc.org/support-and-education/mental-health-education/ ? ?NAMI -[place] ?https://namigu[place]ford.org/ ? ?Community Housing and Support Resources: ? ?[facility] Homeless[facility] ?https://pehgc.org/ ? ?Interactive Resource Center ?https://www.interactiveresourcecenter.org/ ? ?[facility] ?https://www.greensborourbanministry.org/ ? ?[facility] [place] (adult men's shelter) ?www.opendoorministrieshp.org ?[address] ?[place], [place] [address] ?336-885-0191 ?[facility] [place] [facility] ?[address] [place], [place] [address] ?[phone] ? ? ?VA's National Homeless Call Center ?1-877-4AID VET (1-877-424-3838) ? ?Veterans Crisis Line ?1-800-273-8255 press 1 ?Confidential chat-VeteransCrisisLine.net ?Or Text to 838255 ? ?United Way ?Call 211 or 1-888-892-1162 ?www.NC211.org ? ?Affordable Housing Resources in [place] ?nchousingsearch.com   ?1-877-428-8844 ? ?Shelters ? ?[place] [facility] Housing Hotline ?336-691-9521 ?8:30am-5:30pm ?[facility] ?[address] ?[place], [place]  [address] ?336-886-5594 ?Female veterans 18+ with substance abuse issues ?Eligibility:  By Referral Only ? ?[facility] ?[address] ?[place], [place] [address] ?[phone] Ext. 347 ?Adult Men & Women ?Eligibility: Valid ID & Social Security Card ?www.greensborourbanministry.org ? ?[facility] ?[address] ?[place], [place]  [address] ?336-886-5594 ?Female veterans 18+ with substance abuse issues ?Eligibility:  By Referral Only ? ?[facility] ?[address] ?[place], [place]  [address] ?[phone] ?Single women 18+ without dependents ?Open 6pm-8am ?Eligibility:  Valid ID & Social Security Card ?Call to check availability  ?http://westendministries.org/leslieshouse.aspx ? ?[facility] ?[address] ?[place], [place]  [address] ?[phone] ?Female veterans 18+ with substance abuse/mental ?health issues ?Eligibility:  By Referral Only ? ?[facility] ?[address] ?[place], [place] [address] ?[phone] ?Call to check availability ?Males 18+ ?Eligibility: Valid ID & Social Security Card ?www.odm-hp.org ? ?[facility] [place] ?[address] ?[place], [place] [address] ?336-881-5400 ?Women 18+ & Families with children ?Eligibility:  Valid ID  & Criminal Background Check ?www.salvationarmycarolinas.org/commands/[place] ? ? ?[facility] Care of  (Care Management): 877-566-0943 ?[facility] [place] [facility]: Behavioral Health 24 Hour Phone Line 1-800-853-5163 ?NAMI Hotline 336-370-4264 ?NAMI [place] 919-788-0801 ? ?2-1-1 Referral Service ?United Way 211 ?Call 211 or 1-888-892-1162 ?www.NC211.org ?A free United Way, 24/7 telephone information and referral service to help link citizens who are seeking help with the community resources they need. In [place] and [place], just dial 211 from your phone. ?Mental Health Association in  336-373-1402 ?Support groups for anxiety, depression and bipolar disorder, schizophrenia,   family and friends, aftermath of suicide, and mental wellness for Latinos (in Spanish). ?Mental Health Association in [place] 336-883-7480 ?Offers support groups, [facility] program offers. Psychological, vocational, educational and other rehabilitation services to those who suffer from mental health illness of the 18 and up (5 days a week/5hours a day), offer out patient services like diagnostic evaluation, comprehensive clinical assessments, individual counseling, group therapy, psycho-educational workshops, referral to other specialists, referral to a psychiatrist for an evaluation for medication, consultation, and outreach/training. ?ADS (Alcohol and Drug Services) ?([phone] 336-333-6860 ?Substance Abuse education, prevention and treatment (detox, assessments, intensive outpatient and inpatient counseling and programs). ?[facility] ?[place] (336) 370-0195, [place] (336) [phone] ?Support groups for posttraumatic stress, depression, and schizophrenia? as well as day programs for individuals with severe mental illness. ?[facility] [place] ([phone] ?[facility]-FREE-336-275-7896 ?[facility] 336-429-5600 or www.kellinfoundation.org ?Telehealth platform, Individual counseling across  the lifespan for both mental health and substance use, support groups, advocacy, case management, virtual villages, resource coordination. ?[facility]- 1-800-256-2452 ?[facility]-FREE-336-676-6840 or 1-800-853-5163 ?336-676-6849. Provides mental health services to all residents regardless of ability to pay. [address], [place]. [place]. 24 ?Domestic Violence Crisis Line ?[facility] ?Call for shelter and/or safety planning ?[facility] ?336-889-7273 (24/7) ?[facility] ?336-273-7273 (24/7) ? ?VA Homeless Hotline ?877-424-3838 ? ?Veterans Crisis Line ?1-800-273-8255 press 1 ?Confidential chat-VeteransCrisisLine.net ?Or Text to 838255 ? ?[facility] [place] [facility] ?[address] ?[place], [place] [address] ?336-899-1505 ?Hours: Mon-Fri. 8am-5pm ?[facility] Mobile Crisis Management ?Mobile crisis response for mental health, substance abuse or intellectual/developmental disabilities ?1-877-626-1772 ? ? ? ? ?Disclaimer: This resource list is subject to change at any time and is a starting point for resource identification as of 11/09/2021.  ? ?

## 2022-05-15 NOTE — ED Notes (Addendum)
Patient uncooperative, yelling out verbal threats and profanities. Patient repetiviley stating "They're going to murder me", "I'm going to kill these nutcrackers". RN attempted to explain procedures of having to change out and needing to lock up belonging, patient refused and stated "I need to see the law where it says I have to give you my phone".   Refused vitals.   PD at bedside, security arrived to bedside soon after.

## 2022-05-15 NOTE — BH Assessment (Signed)
At 2308 clinician received a crisis call on her desk phone [phone]) from Lou Cal [phone]). The pt was in distress and verbalized suicidal ideations and wanting to go to sleep. Clinician asked the pt for her name and address; however per pt she doesn't know her address because she just moved in. Clinician asked the pt if she had her address on something or if she could look at the numbers outside. As pt was moving the call disconnected. Clinician called the pt number multiple times with no success, clinician then called the Non-emergency number [phone]). Clinician provided Operator [phone] all the information she had, the operator reported she did not see the pt in her the system but will consult with her supervisor and will call her back. Clinician called the pt again and she answered the phone and asked if clinician can call her sister because she has her address. Clinician asked the pt if she consents for her to call her sister, pt consented. Clinician obtained pt's sister name and phone number Sharmon Leyden, [phone]) however after numerous calls pt's sister did not answer. Clinician called the pt back and expressed her sister is not answering, pt to text sister to answer, clinician observed the distress and suicidal ideations. Clinician expressed to the pt that she's working to get the police, EMS to bring her to be assessed. Clinician called the pt's sister back, who answered and she obtained the pt's full name, DOB and address Sheryl Brown, 06-02-1961, [address] [place], [place]). Pt's sister reports, the pt has been depressed lately. Clinician thanked the sister then called 911. Clinician provided the information to the 911 dispatch. Clinician called the pt, expressed she spoke to her sister and the police are on the way; pt asked if the police officers are white then started yelling/crying that the police are going to kill her. Clinician expressed she is not  sure of their race but they are coming out to help her, pt reports, she was going to call her sister and ended the call. Operator [phone] called clinician to follow up clinician discussed she called the police after obtaining pt's address, operator reports, she can see the 911 call for the pt.     Redmond Pulling, MS, [facility], [facility] Triage Specialist [phone]

## 2022-05-15 NOTE — ED Notes (Signed)
Called staffing to check on status of a new sitter coming to relieve the night shift sitter. Per sitter, staffing had told her that someone was coming to relieve her. I called staffing and was told that things got "switched up". Explained the importance of this pt having a sitter d/t SI, HI and IVC, and aggressive behaviors. Per staffing, no one is available at this time and maybe there will be someone at 1100. Notified CN. Will increase rounding as able w/ fluctuations of high demand and high acuity patients. Pt is currently sleeping w/ rise and fall of chest noted.

## 2022-05-15 NOTE — BH Assessment (Addendum)
TTS completed. Discussed clinicals with the [facility] provider Eligha Bridegroom, NP) and patient is psych cleared. She is ok to discharge home. IVC to be rescinded. Patient denies SI, HI, and AVH's.   Patient is recommended to follow up with Partial Hospitalization prior to her discharge. Clinician provided patient with details of the program and expectations (criteria for the program, goals of the program, daily group sessions, times of group sessions, days of the week that the group sessions are held, etc.). Patient agrees to participating in the program.  Patient's contact number number is [phone]. She also has [employer].   Patient's nurse Magda Paganini, RN and Irving Burton, RN) provided disposition updates.

## 2022-05-15 NOTE — BH Assessment (Signed)
@  73, Ok we will let you know when we have the TTS machine. Per nursing, "She has been sleeping for me since I got here. She was given geodon per the night shift RN around 0200ish"  Requested nursing to notify TTS when patient is ready to be assessed.

## 2022-05-15 NOTE — ED Provider Notes (Signed)
[facility] EMERGENCY DEPARTMENT Provider Note   CSN: [id number] Arrival date & time: 05/15/22  0040     History  Chief Complaint  Patient presents with   Suicidal    Sheryl Brown is a 61 y.o. female.  Sheryl Brown is a 61 y.o. female with a history of seizures, stroke, neuropathy, who presents to the emergency department via [employer] reporting suicidal ideations with plan.  Patient was with her sister when she started making threats that she was going to kill herself so her sister called the police.  Upon arrival to the department patient is yelling and very agitated.  Patient reports she is going to kill herself by taking all of her pills.  Patient repeatedly threatening staff and stating that she "wants to kill all of [place] before we kill her first".  Patient reports hearing voices telling her to murder everyone.  To participate in questions, she does not report any medical complaints when I attempted to ask about any concerns patient stated "get me a foreigner I do not want to talk to you anymore".  Spoke with patient's sister who reports she has had similar episodes in the past.  The history is provided by the patient, the police and a relative.       Home Medications Prior to Admission medications   Medication Sig Start Date End Date Taking? Authorizing Provider  amitriptyline (ELAVIL) 25 MG tablet Take 0.5 tablets (12.5 mg total) by mouth at bedtime. Patient not taking: Reported on 05/09/2022 08/06/21   Windell Norfolk, MD  amitriptyline (ELAVIL) 50 MG tablet Take 50 mg by mouth at bedtime. 01/11/22   [provider]  amLODipine (NORVASC) 5 MG tablet TAKE 1 TABLET(5 MG) BY MOUTH DAILY Patient taking differently: Take 5 mg by mouth daily. 09/12/21   Grayce Sessions, NP  amoxicillin-clavulanate (AUGMENTIN) 875-125 MG tablet Take 1 tablet by mouth every 12 (twelve) hours. Patient not taking: Reported on 05/09/2022 12/25/21   Kommor, Wyn Forster, MD   atorvastatin (LIPITOR) 20 MG tablet Take 1 tablet (20 mg total) by mouth daily. 07/22/21   Grayce Sessions, NP  gabapentin (NEURONTIN) 400 MG capsule Take 1 capsule (400 mg total) by mouth 3 (three) times daily. Patient not taking: Reported on 11/20/2021 06/11/21 07/11/21  Grayce Sessions, NP  lamoTRIgine (LAMICTAL) 100 MG tablet Take 1 tablet (100 mg total) by mouth daily. Patient not taking: Reported on 05/09/2022 06/12/21 06/12/22  Grayce Sessions, NP  naproxen (NAPROSYN) 375 MG tablet Take 1 tablet (375 mg total) by mouth 2 (two) times daily. 12/25/21   Kommor, Madison, MD  omeprazole (PRILOSEC) 40 MG capsule Take 40 mg by mouth daily. 06/29/20   [provider]  PARoxetine (PAXIL) 20 MG tablet Take 20 mg by mouth daily. 10/03/21   [provider]  valsartan (DIOVAN) 160 MG tablet TAKE 1 TABLET(160 MG) BY MOUTH DAILY Patient taking differently: Take 160 mg by mouth 2 (two) times daily. 09/12/21   Grayce Sessions, NP      Allergies    Gabapentin    Review of Systems   Review of Systems  Psychiatric/Behavioral:  Positive for agitation and suicidal ideas.     Physical Exam Updated Vital Signs BP 130/81   Pulse 94   Temp 98.3 F (36.8 C)   Resp 12   SpO2 98%  Physical Exam Vitals and nursing note reviewed.  Constitutional:      Appearance: Normal appearance. She is well-developed. She  is not ill-appearing or diaphoretic.     Comments: Patient is alert, yelling at staff, quite agitated  HENT:     Head: Normocephalic and atraumatic.  Eyes:     General:        Right eye: No discharge.        Left eye: No discharge.  Cardiovascular:     Rate and Rhythm: Normal rate.  Pulmonary:     Effort: Pulmonary effort is normal. No respiratory distress.     Comments: Respirations equal and unlabored, patient screaming in full sentences, satting well on room air Abdominal:     General: There is no distension.  Musculoskeletal:        General: No deformity.   Neurological:     Mental Status: She is alert and oriented to person, place, and time.     Coordination: Coordination normal.     Comments: Moving all extremities with normal coordination  Psychiatric:        Attention and Perception: She perceives auditory hallucinations.        Mood and Affect: Affect is labile.        Speech: Speech is rapid and pressured.        Behavior: Behavior is agitated.        Thought Content: Thought content includes homicidal and suicidal ideation. Thought content includes suicidal plan.     Comments: Patient is quite agitated, yelling threats at staff, reporting auditory hallucinations telling her to kill everyone in the room, speech is rapid and pressured. Repeatedly stating she wants to take all her pills and kill herself     ED Results / Procedures / Treatments   Labs (all labs ordered are listed, but only abnormal results are displayed) Labs Reviewed  CBC WITH DIFFERENTIAL/PLATELET - Abnormal; Notable for the following components:      Result Value   Lymphs Abs 4.2 (*)    All other components within normal limits  RESP PANEL BY RT-PCR (FLU A&B, COVID) ARPGX2  COMPREHENSIVE METABOLIC PANEL  ETHANOL  RAPID URINE DRUG SCREEN, HOSP PERFORMED  ACETAMINOPHEN LEVEL  SALICYLATE LEVEL    EKG None  Radiology No results found.  Procedures Procedures    Medications Ordered in ED Medications  ziprasidone (GEODON) injection 20 mg (20 mg Intramuscular Given 05/15/22 0130)  sterile water (preservative free) injection (  Given 05/15/22 0247)    ED Course/ Medical Decision Making/ A&P                           Medical Decision Making Amount and/or Complexity of Data Reviewed Labs: ordered.  Risk Prescription drug management.   61 y.o. female presents to the ED with complaints of suicidal ideation, agitation, this involves an extensive number of treatment options, and is a complaint that carries with it a high risk of complications and morbidity.    On arrival pt is nontoxic, vitals WNL.   Patient very agitated on arrival, screaming and threatening staff, required chemical restraint with Geodon, after which patient had improvement in agitation and was able to sleep  Additional history obtained from patient's Sister Lindaann Slough via telephone was. Previous records obtained and reviewed     Lab Tests:  I Ordered, reviewed, and interpreted labs, which included: No leukocytosis and normal hemoglobin, no significant electrolyte derangements, negative COVID, tox labs significant for ethanol level of 199, negative acetaminophen and salicylate levels, UDS pending  EKG: Normal sinus rhythm without QT prolongation  ED Course:   Patient is medically cleared for psychiatric evaluation.  Patient has been placed under IVC and first exam has been completed.  After receiving Geodon patient is sleeping and will need to metabolize before she can participate in psychiatric evaluation.  TTS consult placed.  The patient has been placed in psychiatric observation due to the need to provide a safe environment for the patient while obtaining psychiatric consultation and evaluation, as well as ongoing medical and medication management to treat the patient's condition.  The patient has been placed under full IVC at this time.     Portions of this note were generated with Scientist, clinical (histocompatibility and immunogenetics). Dictation errors may occur despite best attempts at proofreading.         Final Clinical Impression(s) / ED Diagnoses Final diagnoses:  Suicidal ideation  Agitation    Rx / DC Orders ED Discharge Orders     None         Dartha Lodge, [place] 05/15/22 0401    Shon Baton, MD 05/15/22 [id number]

## 2022-05-15 NOTE — BH Assessment (Signed)
Per Fidela Salisbury, RN: "no she's unable to engage at this time. She received Geodon earlier." Clinician asked RN to inform her when the pt is able to be assessed.    Redmond Pulling, MS, [facility], [facility] Triage Specialist [phone]

## 2022-05-15 NOTE — BH Assessment (Addendum)
Comprehensive Clinical Assessment (CCA) Note  05/15/2022 Sheryl Brown [id number]  Disposition: TTS completed. Discussed clinicals with the [facility] provider Eligha Bridegroom, NP) and patient is psych cleared. She is ok to discharge home. IVC to be rescinded. Patient denies SI, HI, and AVH's. Patient is recommended to follow up with Partial Hospitalization prior to her discharge. Clinician provided patient with details of the program and expectations (criteria for the program, goals of the program, daily group sessions, times of group sessions, days of the week that the group sessions are held, etc.). Patient agrees to participating in the program.   Also, contacted the providers for the Partial Hospitalization program requesting that patient is provided a follow up to discuss her start date and further details of the program. Patient's contact number is [phone]. She also has [employer].   Patient's nurse Magda Paganini, RN and Irving Burton, RN) provided disposition updates  Flowsheet Row ED from 05/15/2022 in [facility] EMERGENCY DEPARTMENT ED from 05/09/2022 in [facility] from 12/25/2021 in [facility]  C-SSRS RISK CATEGORY High Risk No Risk No Risk       Chief Complaint:  Chief Complaint  Patient presents with   Suicidal   Psychiatric Evaluation   Visit Diagnosis: Major Depressive Disorder, Recurrent, Severe, without psychotic features    Sheryl Brown is a 61 y.o. female with a history of seizures, stroke, neuropathy, who presents to the emergency department via [employer] reporting suicidal ideations with plan. States that her sister called [employer] fearful that patient would hurt herself. Patient admits that she told her sister that she was thinking about committing suicidal and that she was tired. Per patient, "I am sad that about my mother's death, it's getting close to the anniversary of her  death, and I let it get the best of me". States that her mother passed away 06/13/19.   Patient admits that she had suicidal ideations upon arrival to the Emergency Department and has felt suicidal intermittently since her mothers death.. She had a plan to overdose when she told her sister that she was suicidal. She reports having access to means (prescription and OTC medications). Denies access to firearms.    Patient now denies that she has current suicidal ideations. States that since she has been admitted to the  Emergency Department, she spoke to her sister and now feels better. She reports feeling remorseful about being brought to the Emergency Room under such circumstances. Patient states, "I realize that I need to talk to somebody like a psychiatrist about my problems". Patient able to provide future-focused thinking in today's TTS assessment. Denies prior suicide attempts and/or gestures. States that she is able to contract for safety. She identifies protective factors consisting of her sister and 22 grandchildren. Denies a history of self-injurious behaviors.   Current depressive symptoms: hopelessness, worthlessness, fatigue, lack of interest in usual pleasures, angry/irritable, isolating self from others, despondence, tearful, crying spells, lack of bathing/grooming, lack of motivation to complete task, and insomnia.   States that she typically sleeps well with medication aids. She takes a sleeping medication but states that she is unable to recall the name of the medication. She wakes up frequently throughout the night, typically sleeps 45 minutes at a time, stays up for an hour, and then goes back to sleep. She sleeps no more than 1x per day and when she eats its crackers, apple sauce, and cheese. Appetite is fair. Denies significant  weight gain/loss. She does report symptoms of moderate anxiety and panic attacks.   Patient denies homicidal ideations. No history of aggressive and/or  assaultive behaviors. No criminal charges pending. No upcoming court dates. Patient is not on probation and/or parole. Denies AVH's. Denies symptoms related to paranoid.  She denies drug use. Patient does drink alcohol occasionally. Age of first use is 61 yrs old. She drinks social or during holidays. Last use was yesterday, 05/14/2022. She reports drinking #4 mixed drinks. Denies withdrawal symptoms. No history of alcohol induced seizures, black outs, and/or DT's.   Patient does not have an outpatient therapist. However, requesting to get connected to a therapist so that she has someone to talk to about her depression.   Denies that she has a psychiatrist. However, has a PCP. She reports 1 prior inpatient psychiatric [place] years [place]. States that she was also hospitalized because she was "tired of people".   Patient is single. She has 3 children. Currently lives alone. She is receiving disability since 2016 for medical Hx of meningitis, stroke, and seizures. Last seizure was 1 year ago. Highest level of education is H.S and trade school. Support system is her sister,  son Orlie Pollen(Jamal), and granddaughter Levert Feinstein(Kamara). Denies history of trauma and/or abuse. Patient states that she feels she is best helped today if we can connect her to an outpatient therapist and/or psychiatrist.   CCA Screening, Triage and Referral (STR)  Patient Reported Information How did you hear about us? Legal System (The history is provided by the patient, the police and a relative.)  What Is the Reason for Your Visit/Call Today? Sheryl Brown is a 61 y.o. female with a history of seizures, stroke, neuropathy, who presents to the emergency department via [employer] reporting suicidal ideations with plan.  Patient was with her sister when she started making threats that she was going to kill herself so her sister called the police.  Upon arrival to the department patient is yelling and very  agitated.  Patient reports she is going to kill herself by taking all of her pills.  Patient repeatedly threatening staff and stating that she "wants to kill all of us before we kill her first".  Patient reports hearing voices telling her to murder everyone.  To participate in questions, she does not report any medical complaints when I attempted to ask about any concerns patient stated "get me a foreigner I do not want to talk to you anymore".   Spoke with patient's sister who reports she has had similar episodes in the past.   The history is provided by the patient, the police and a relative.  How Long Has This Been Causing You Problems? > than 6 months  What Do You Feel Would Help You the Most Today? Treatment for Depression or other mood problem   Have You Recently Had Any Thoughts About Hurting Yourself? Yes  Are You Planning to Commit Suicide/Harm Yourself At This time? No   Have you Recently Had Thoughts About Hurting Someone Karolee Ohslse? No  Are You Planning to Harm Someone at This Time? No  Explanation: No data recorded  Have You Used Any Alcohol or Drugs in the Past 24 Hours? Yes  How Long Ago Did You Use Drugs or Alcohol? No data recorded What Did You Use and How Much? She denies drug use. Patient does drink alcohol occasionally. Age of first use is 61 yrs old. She drinks social or during holidays. Last use  was yesterday, 05/14/2022. She reports drinking #4 mixed drinks. Denies withdrawal symptoms. No history of alcohol induced seizures, black outs, and/or DT's.   Do You Currently Have a Therapist/Psychiatrist? Yes  Name of Therapist/Psychiatrist: Patient does not have an outpatient therapist. However, requesting to get connected to a therapist. States, "I would like to have someone to talk to about my problems". Denies that she has a psychiatrist. She has a PCP. She reports 1 prior inpatient psychiatric admission for depression and "I was tired of people". She was hospitalized in  [place], [place].   Have You Been Recently Discharged From Any Office Practice or Programs? No  Explanation of Discharge From Practice/Program: No data recorded    CCA Screening Triage Referral Assessment Type of Contact: Tele-Assessment  Telemedicine Service Delivery: Telemedicine service delivery: This service was provided via telemedicine using a 2-way, interactive audio and video technology  Is this Initial or Reassessment? Initial Assessment  Date Telepsych consult ordered in [facility]:  05/15/22  Time Telepsych consult ordered in [facility]:  No data recorded Location of Assessment: [facility] ED  Provider Location: Other (comment) ([facility])   Collateral Involvement: No collateral obtained.   Does Patient Have a Automotive engineer Guardian? No data recorded Name and Contact of Legal Guardian: No data recorded If Minor and Not Living with Parent(s), Who has Custody? n/a  Is CPS involved or ever been involved? Never  Is APS involved or ever been involved? Never   Patient Determined To Be At Risk for Harm To Self or Others Based on Review of Patient Reported Information or Presenting Complaint? No  Method: No data recorded Availability of Means: No data recorded Intent: No data recorded Notification Required: No data recorded Additional Information for Danger to Others Potential: No data recorded Additional Comments for Danger to Others Potential: No data recorded Are There Guns or Other Weapons in Your Home? No data recorded Types of Guns/Weapons: No data recorded Are These Weapons Safely Secured?                            No data recorded Who Could Verify You Are Able To Have These Secured: No data recorded Do You Have any Outstanding Charges, Pending Court Dates, Parole/Probation? No data recorded Contacted To Inform of Risk of Harm To Self or Others: Other: Comment (n/a)    Does Patient Present under Involuntary Commitment? No  IVC Papers Initial File Date: No data  recorded  [place] of Residence: [place]   Patient Currently Receiving the Following Services: -- (Patient has no psychiatric service in place at this time.)   Determination of Need: Emergent (2 hours)   Options For Referral: Partial Hospitalization; Intensive Outpatient Therapy; Medication Management     CCA Biopsychosocial Patient Reported Schizophrenia/Schizoaffective Diagnosis in Past: No   Strengths: calm and cooperative; motivated to seek help; asking to be set up with a therapis throughout the TTS assessment.   Mental Health Symptoms Depression:   Change in energy/activity; Hopelessness; Fatigue; Increase/decrease in appetite; Irritability; Sleep (too much or little); Tearfulness; Worthlessness   Duration of Depressive symptoms:  Duration of Depressive Symptoms: Greater than two weeks   Mania:   N/A   Anxiety:    Worrying; Tension; Sleep; Restlessness; Irritability; Fatigue; Difficulty concentrating   Psychosis:   None (Denies; However, H&P notes that patient "repeatedly threatening staff and stating that she "wants to kill all of [place] before we kill her first".  Patient reports hearing voices telling  her to murder everyone". Patient denies this during today's TTS.)   Duration of Psychotic symptoms:    Trauma:   None   Obsessions:   None   Compulsions:   None   Inattention:   N/A   Hyperactivity/Impulsivity:   None   Oppositional/Defiant Behaviors:   None   Emotional Irregularity:   Intense/inappropriate anger; Mood lability; Potentially harmful impulsivity; Recurrent suicidal behaviors/gestures/threats   Other Mood/Personality Symptoms:   Currently calm and cooperative. Uncooperative at admission and given Geodon.    Mental Status Exam Appearance and self-care  Stature:   Average   Weight:   Average weight   Clothing:   Neat/clean   Grooming:  No data recorded  Cosmetic use:   Age appropriate   Posture/gait:   Normal   Motor  activity:   Not Remarkable   Sensorium  Attention:   Normal   Concentration:   Normal   Orientation:   Time; Situation; Place; Person; Object   Recall/memory:   Normal   Affect and Mood  Affect:   Depressed; Flat   Mood:   Depressed   Relating  Eye contact:   Normal   Facial expression:   Depressed; Sad   Attitude toward examiner:   Cooperative   Thought and Language  Speech flow:  Clear and Coherent   Thought content:   Appropriate to Mood and Circumstances   Preoccupation:   None   Hallucinations:   None   Organization:  No data recorded  [employer] of Knowledge:   Average   Intelligence:   Average   Abstraction:   Normal   Judgement:   Fair   Dance movement psychotherapist:   Adequate   Insight:   Good   Decision Making:   Normal   Social Functioning  Social Maturity:   Responsible   Social Judgement:   Normal   Stress  Stressors:   Grief/losses   Coping Ability:   Normal   Skill Deficits:   Activities of daily living   Supports:   Support needed     Religion: Religion/Spirituality Are You A Religious Person?: Yes How Might This Affect Treatment?: n/a  Leisure/Recreation: Leisure / Recreation Do You Have Hobbies?: No  Exercise/Diet: Exercise/Diet Do You Exercise?: No Have You Gained or Lost A Significant Amount of Weight in the Past Six Months?: No Do You Follow a Special Diet?: No Do You Have Any Trouble Sleeping?: Yes Explanation of Sleeping Difficulties: sleeps no more than 45 minutes at a time; up for an hour before falling back to sleep   CCA Employment/Education Employment/Work Situation: Employment / Work Situation Employment Situation: On disability Why is Patient on Disability: She is receiving disability since 2016 for medical Hx of meningitis, stroke, and seizures. Last seizure was 1 year ago. How Long has Patient Been on Disability: Since 2016 Patient's Job has Been Impacted by Current  Illness: No Has Patient ever Been in the Military?: No  Education: Education Is Patient Currently Attending School?: No Last Grade Completed:  (Highest level of education is H.S and trade school.) Did Theme park manager?: Yes (Trade School) What Type of College Degree Do you Have?: Trade School Did You Have An Individualized Education Program (IIEP): No Did You Have Any Difficulty At School?: No Patient's Education Has Been Impacted by Current Illness: No   CCA Family/Childhood History Family and Relationship History: Family history Marital status: Single Does patient have children?: Yes How many children?: 3 How is patient's relationship with  their children?: Patient has a good relationship with children. Specifially, mentions that she is close to her son Orlie Pollen).  Childhood History:  Childhood History By whom was/is the patient raised?: Mother Did patient suffer any verbal/emotional/physical/sexual abuse as a child?: No Did patient suffer from severe childhood neglect?: No Has patient ever been sexually abused/assaulted/raped as an adolescent or adult?: No Was the patient ever a victim of a crime or a disaster?: No Witnessed domestic violence?: No Has patient been affected by domestic violence as an adult?: No  Child/Adolescent Assessment:     CCA Substance Use Alcohol/Drug Use: Alcohol / Drug Use Pain Medications: SEE MAR Prescriptions: SEE MAR Over the Counter: SEE MAr History of alcohol / drug use?: Yes Longest period of sobriety (when/how long): Patient states that she drinks during holidays. Negative Consequences of Use:  (None reported.) Substance #1 Name of Substance 1: Alcohol 1 - Age of First Use: 61 yrs old 1 - Amount (size/oz): varies; 4-5 mixed drinks 1 - Frequency: Holidays 1 - Duration: on-going 1 - Last Use / Amount: 05/14/2022; 4 mixed drinks 1 - Method of Aquiring: store 1- Route of Use: oral                       ASAM's:  Six  Dimensions of Multidimensional Assessment  Dimension 1:  Acute Intoxication and/or Withdrawal Potential:      Dimension 2:  Biomedical Conditions and Complications:      Dimension 3:  Emotional, Behavioral, or Cognitive Conditions and Complications:     Dimension 4:  Readiness to Change:     Dimension 5:  Relapse, Continued use, or Continued Problem Potential:     Dimension 6:  Recovery/Living Environment:     ASAM Severity Score:    ASAM Recommended Level of Treatment:     Substance use Disorder (SUD) Substance Use Disorder (SUD)  Checklist Symptoms of Substance Use: Continued use despite having a persistent/recurrent physical/psychological problem caused/exacerbated by use, Substance(s) often taken in larger amounts or over longer times than was intended  Recommendations for Services/Supports/Treatments: Recommendations for Services/Supports/Treatments Recommendations For Services/Supports/Treatments: Medication Management, Intensive In-Home Services, Partial Hospitalization  Discharge Disposition:    DSM5 Diagnoses: Patient Active Problem List   Diagnosis Date Noted   Vitamin D deficiency 09/01/2019   Seizure (HCC) 01/18/2019   TIA (transient ischemic attack) 01/18/2019   HTN (hypertension) 01/28/2018   Hyperlipidemia 06/03/2017   Chronic bilateral low back pain without sciatica 12/12/2016   URI 01/20/2009   TOBACCO ABUSE 12/08/2008   CANDIDIASIS, VAGINAL 10/21/2008   DEPRESSION, SITUATIONAL 05/31/2008   DENTAL PAIN 02/23/2008   ACUTE GINGIVITIS NONPLAQUE INDUCED 12/24/2007   INGUINAL PAIN, RIGHT 12/24/2007   UTI 11/26/2007   ROTATOR CUFF SYNDROME, RIGHT 07/24/2007   GERD 07/23/2007   ELEVATED BLOOD PRESSURE WITHOUT DIAGNOSIS OF HYPERTENSION 05/28/2007   SYMPTOM, PAIN, ABDOMINAL, GENERALIZED 11/25/2006   LABYRINTHITIS, HX OF 09/11/2006     Referrals to Alternative Service(s): Referred to Alternative Service(s):   Place:   Date:   Time:    Referred to Alternative  Service(s):   Place:   Date:   Time:    Referred to Alternative Service(s):   Place:   Date:   Time:    Referred to Alternative Service(s):   Place:   Date:   Time:     Melynda Ripple, Counselor

## 2022-05-15 NOTE — ED Notes (Signed)
Received message from [facility] counselor. Notified BH counselor that staff would locate the TTS machine and let her know when we have it in the room. Awaiting acknowledgement of message.

## 2022-05-15 NOTE — BH Assessment (Signed)
Clinician messaged Fidela Salisbury, RN: "Hey. It's Trey with TTS. Is the pt able to engage in the assessment, if so the pt will need to be placed in a private room. Is the pt under IVC? Also is the pt medically cleared? Fax IVC to [phone]."  Clinician awaiting response.    Redmond Pulling, MS, [facility], [facility] Triage Specialist [phone]

## 2022-05-15 NOTE — ED Notes (Signed)
IVC PAPERS HAVE BEEN COMPLETED

## 2022-05-15 NOTE — ED Triage Notes (Signed)
Patient arrived with PD stating she is going to kill herself. During triage states "I'm going to kill all these nutcrackers", "she is tired of all this", "I just want to sleep forever", "they're going to kill me".

## 2022-06-29 ENCOUNTER — Encounter (HOSPITAL_COMMUNITY): Payer: Self-pay | Admitting: Emergency Medicine

## 2022-06-29 ENCOUNTER — Ambulatory Visit (HOSPITAL_COMMUNITY)
Admission: EM | Admit: 2022-06-29 | Discharge: 2022-06-29 | Disposition: A | Discharge status: Home / Self Care | Payer: Medicare Other | Attending: Emergency Medicine | Admitting: Emergency Medicine

## 2022-06-29 DIAGNOSIS — F17210 Nicotine dependence, cigarettes, uncomplicated: Secondary | ICD-10-CM

## 2022-06-29 DIAGNOSIS — K05219 Aggressive periodontitis, localized, unspecified severity: Secondary | ICD-10-CM

## 2022-06-29 DIAGNOSIS — K056 Periodontal disease, unspecified: Secondary | ICD-10-CM

## 2022-06-29 MED ORDER — FLUCONAZOLE 150 MG PO TABS: ORAL_TABLET | ORAL | 0 refills | Status: DC

## 2022-06-29 MED ORDER — IBUPROFEN 800 MG PO TABS
800.0000 mg | ORAL_TABLET | Freq: Once | ORAL | Status: AC
  Administered 2022-06-29: 800 mg via ORAL

## 2022-06-29 MED ORDER — IBUPROFEN 800 MG PO TABS
ORAL_TABLET | ORAL | Status: AC
  Filled 2022-06-29: qty 1

## 2022-06-29 MED ORDER — IBUPROFEN 800 MG PO TABS: 800.0000 mg | ORAL_TABLET | Freq: Three times a day (TID) | ORAL | 0 refills | Status: DC | PRN

## 2022-06-29 MED ORDER — LIDOCAINE VISCOUS HCL 2 % MT SOLN
OROMUCOSAL | Status: AC
  Filled 2022-06-29: qty 15

## 2022-06-29 MED ORDER — LIDOCAINE VISCOUS HCL 2 % MT SOLN: 15.0000 mL | OROMUCOSAL | 0 refills | Status: DC | PRN

## 2022-06-29 MED ORDER — AMOXICILLIN-POT CLAVULANATE 875-125 MG PO TABS: 1.0000 | ORAL_TABLET | Freq: Two times a day (BID) | ORAL | 0 refills | Status: AC

## 2022-06-29 MED ORDER — LIDOCAINE VISCOUS HCL 2 % MT SOLN
15.0000 mL | Freq: Once | OROMUCOSAL | Status: AC
  Administered 2022-06-29: 15 mL via OROMUCOSAL

## 2022-06-29 NOTE — ED Provider Notes (Signed)
[facility]    CSN: [id number] Arrival date & time: 06/29/22  1340    HISTORY   Chief Complaint  Patient presents with   Mouth Lesions   HPI Sheryl Brown is a pleasant, 61 y.o. female who presents to urgent care today. Patient c/o oral lesions/gum swelling x 2 days. Requesting antibiotic and pain medication.  Patient has had similar symptoms in the past and has been given a tablet for pain, does not recall the name.  Patient states she took 600 mg of ibuprofen last night without meaningful pain relief.  Patient states she currently does not have a dentist.  Patient denies dental pain, fever, aches, chills.  The history is provided by the patient.   Past Medical History:  Diagnosis Date   Meningitis 2013   Neuropathy    Seasonal allergies    Seizures (HCC)    Stroke [facility])    Patient Active Problem List   Diagnosis Date Noted   Vitamin D deficiency 09/01/2019   Seizure (HCC) 01/18/2019   TIA (transient ischemic attack) 01/18/2019   HTN (hypertension) 01/28/2018   Hyperlipidemia 06/03/2017   Chronic bilateral low back pain without sciatica 12/12/2016   URI 01/20/2009   TOBACCO ABUSE 12/08/2008   CANDIDIASIS, VAGINAL 10/21/2008   DEPRESSION, SITUATIONAL 05/31/2008   DENTAL PAIN 02/23/2008   ACUTE GINGIVITIS NONPLAQUE INDUCED 12/24/2007   INGUINAL PAIN, RIGHT 12/24/2007   UTI 11/26/2007   ROTATOR CUFF SYNDROME, RIGHT 07/24/2007   GERD 07/23/2007   ELEVATED BLOOD PRESSURE WITHOUT DIAGNOSIS OF HYPERTENSION 05/28/2007   SYMPTOM, PAIN, ABDOMINAL, GENERALIZED 11/25/2006   LABYRINTHITIS, HX OF 09/11/2006   Past Surgical History:  Procedure Laterality Date   CESAREAN SECTION     OB History   No obstetric history on file.    Home Medications    Prior to Admission medications   Medication Sig Start Date End Date Taking? Authorizing Provider  amitriptyline (ELAVIL) 50 MG tablet Take 50 mg by mouth at bedtime. 01/11/22   [provider]  amLODipine  (NORVASC) 5 MG tablet TAKE 1 TABLET(5 MG) BY MOUTH DAILY Patient taking differently: Take 5 mg by mouth daily. 09/12/21   Grayce Sessions, NP  atorvastatin (LIPITOR) 20 MG tablet Take 1 tablet (20 mg total) by mouth daily. 07/22/21   Grayce Sessions, NP  naproxen (NAPROSYN) 375 MG tablet Take 1 tablet (375 mg total) by mouth 2 (two) times daily. 12/25/21   Kommor, Madison, MD  omeprazole (PRILOSEC) 40 MG capsule Take 40 mg by mouth daily. 06/29/20   [provider]  PARoxetine (PAXIL) 20 MG tablet Take 20 mg by mouth daily. 10/03/21   [provider]  valsartan (DIOVAN) 160 MG tablet TAKE 1 TABLET(160 MG) BY MOUTH DAILY Patient taking differently: Take 160 mg by mouth 2 (two) times daily. 09/12/21   Grayce Sessions, NP    Family History Family History  Problem Relation Age of Onset   Heart failure Mother    Cirrhosis Father    Social History Social History   Tobacco Use   Smoking status: Every Day    Packs/day: 0.25    Types: Cigarettes   Smokeless tobacco: Never  Vaping Use   Vaping Use: Never used  Substance Use Topics   Alcohol use: Yes    Comment: only on holidays   Drug use: Never   Allergies   Gabapentin  Review of Systems Review of Systems Pertinent findings revealed after performing a 14 point review of systems  has been noted in the history of present illness.  Physical Exam Triage Vital Signs ED Triage Vitals  Enc Vitals Group     BP 09/07/21 0827 (!) 147/82     Pulse Rate 09/07/21 0827 72     Resp 09/07/21 0827 18     Temp 09/07/21 0827 98.3 F (36.8 C)     Temp Source 09/07/21 0827 Oral     SpO2 09/07/21 0827 98 %     Weight --      Height --      Head Circumference --      Peak Flow --      Pain Score 09/07/21 0826 5     Pain Loc --      Pain Edu? --      Excl. in GC? --   No data found.  Updated Vital Signs BP (!) 145/113 (BP Location: Right Arm)   Pulse 88   Temp 98.3 F (36.8 C) (Oral)   Resp 16   SpO2 100%    Physical Exam Vitals and nursing note reviewed.  Constitutional:      General: She is not in acute distress.    Appearance: Normal appearance.  HENT:     Head: Normocephalic and atraumatic.     Mouth/Throat:     Dentition: Does not have dentures. Gingival swelling (With possible abscess on right upper and lower gingiva) and dental caries present. No dental tenderness, dental abscesses or gum lesions.  Eyes:     Pupils: Pupils are equal, round, and reactive to light.  Cardiovascular:     Rate and Rhythm: Normal rate and regular rhythm.  Pulmonary:     Effort: Pulmonary effort is normal.     Breath sounds: Normal breath sounds.  Musculoskeletal:        General: Normal range of motion.     Cervical back: Full passive range of motion without pain, normal range of motion and neck supple.  Lymphadenopathy:     Cervical: No cervical adenopathy.  Skin:    General: Skin is warm and dry.  Neurological:     General: No focal deficit present.     Mental Status: She is alert and oriented to person, place, and time. Mental status is at baseline.  Psychiatric:        Mood and Affect: Mood normal.        Behavior: Behavior normal.        Thought Content: Thought content normal.        Judgment: Judgment normal.     Visual Acuity Right Eye Distance:   Left Eye Distance:   Bilateral Distance:    Right Eye Near:   Left Eye Near:    Bilateral Near:     UC Couse / Diagnostics / Procedures:     Radiology No results found.  Procedures Procedures (including critical care time) EKG  Pending results:  Labs Reviewed - No data to display  Medications Ordered in UC: Medications  ibuprofen (ADVIL) tablet 800 mg (800 mg Oral Given 06/29/22 1537)  lidocaine (XYLOCAINE) 2 % viscous mouth solution 15 mL (15 mLs Mouth/Throat Given 06/29/22 1537)    UC Diagnoses / Final Clinical Impressions(s)   I have reviewed the triage vital signs and the nursing notes.  Pertinent labs & imaging  results that were available during my care of the patient were reviewed by me and considered in my medical decision making (see chart for details).    Final diagnoses:  Gingival  abscess  Periodontal disease  Tobacco dependence due to cigarettes   Patient advised to follow-up with dentist regarding her likely periodontal disease.  Smoking cessation encouraged.  Patient provided with a prescription for Augmentin for possible abscess of gingiva due to advanced periodontal disease.  Return precautions advised.  ED Prescriptions     Medication Sig Dispense Auth. Provider   amoxicillin-clavulanate (AUGMENTIN) 875-125 MG tablet Take 1 tablet by mouth 2 (two) times daily for 10 days. 20 tablet Theadora Rama Scales, PA-C   lidocaine (XYLOCAINE) 2 % solution Use as directed 15 mLs in the mouth or throat every 3 (three) hours as needed for mouth pain (Sore throat). 300 mL Theadora Rama Scales, PA-C   ibuprofen (ADVIL) 800 MG tablet Take 1 tablet (800 mg total) by mouth every 8 (eight) hours as needed for up to 21 doses for fever, headache, mild pain or moderate pain. 21 tablet Theadora Rama Scales, PA-C   fluconazole (DIFLUCAN) 150 MG tablet Take 1 tablet today.  Take second tablet 3 days later. 2 tablet Theadora Rama Scales, PA-C      PDMP not reviewed this encounter.  Pending results:  Labs Reviewed - No data to display  Discharge Instructions:   Discharge Instructions      Please see the list below for recommended medications, dosages and frequencies to provide relief of your current symptoms:    Augmentin (amoxicillin - clavulanic acid):  take 1 tablet twice daily for 10 days, you can take it with or without food.  This antibiotic can cause upset stomach, this will resolve once antibiotics are complete.  You are welcome to use a probiotic, eat yogurt, take Imodium while taking this medication.  Please avoid other systemic medications such as Maalox, Pepto-Bismol or milk of magnesia  as they can interfere with your body's ability to absorb the antibiotics.   Diflucan (fluconazole): As you may or may not be aware, taking antibiotics can often cause patients to develop a vaginal yeast infection.  For this reason, I have provided you with a prescription for Diflucan.  Please take the first Diflucan tablet on day 3 or 4 of your antibiotic therapy, and take the second Diflucan tablet 3 days later.  You do not need to pick up this prescription or take this medication unless you develop symptoms of vaginal yeast infection including thick, white vaginal discharge and/or vaginal itching.  This prescription has been provided as a Research officer, political party and for your convenience.  Advil, Motrin (ibuprofen): This is a good anti-inflammatory medication which addresses aches, pains and inflammation of the upper airways that causes sinus and nasal congestion as well as in the lower airways which makes your cough feel tight and sometimes burn.  I recommend that you take between 600 to 800 mg every 6-8 hours as needed.      Xylocaine (lidocaine): This is a numbing medication that can be swished for 15 seconds and swallowed.  You can use this every 3 hours while awake to relieve pain in your mouth and throat.  I have sent a prescription for this medication to your pharmacy.   Please follow-up within the next 5-7 days either with your primary care provider or urgent care if your symptoms do not resolve.  If you do not have a primary care provider, we will assist you in finding one.  Please be sure you also follow-up with a dentist for further evaluation of your gums.  Keep in mind that the antibiotics prescribed today are only a  temporary fix and that dental care is a permanent solution.  You may also wish to consider quitting smoking as smoking cigarettes can worsen periodontal disease.   Thank you for visiting urgent care today.  We appreciate the opportunity to participate in your care.     Disposition Upon  Discharge:  Condition: stable for discharge home  Patient presented with an acute illness with associated systemic symptoms and significant discomfort requiring urgent management. In my opinion, this is a condition that a prudent lay person (someone who possesses an average knowledge of health and medicine) may potentially expect to result in complications if not addressed urgently such as respiratory distress, impairment of bodily function or dysfunction of bodily organs.   Routine symptom specific, illness specific and/or disease specific instructions were discussed with the patient and/or caregiver at length.   As such, the patient has been evaluated and assessed, work-up was performed and treatment was provided in alignment with urgent care protocols and evidence based medicine.  Patient/parent/caregiver has been advised that the patient may require follow up for further testing and treatment if the symptoms continue in spite of treatment, as clinically indicated and appropriate.  Patient/parent/caregiver has been advised to return to the [facility] or PCP if no better; to PCP or the Emergency Department if new signs and symptoms develop, or if the current signs or symptoms continue to change or worsen for further workup, evaluation and treatment as clinically indicated and appropriate  The patient will follow up with their current PCP if and as advised. If the patient does not currently have a PCP we will assist them in obtaining one.   The patient may need specialty follow up if the symptoms continue, in spite of conservative treatment and management, for further workup, evaluation, consultation and treatment as clinically indicated and appropriate.   Patient/parent/caregiver verbalized understanding and agreement of plan as discussed.  All questions were addressed during visit.  Please see discharge instructions below for further details of plan.  This office note has been dictated using Paediatric nurse.  Unfortunately, this method of dictation can sometimes lead to typographical or grammatical errors.  I apologize for your inconvenience in advance if this occurs.  Please do not hesitate to reach out to me if clarification is needed.      Theadora Rama Scales, PA-C 06/29/22 1616

## 2022-06-29 NOTE — Discharge Instructions (Addendum)
Please see the list below for recommended medications, dosages and frequencies to provide relief of your current symptoms:    Augmentin (amoxicillin - clavulanic acid):  take 1 tablet twice daily for 10 days, you can take it with or without food.  This antibiotic can cause upset stomach, this will resolve once antibiotics are complete.  You are welcome to use a probiotic, eat yogurt, take Imodium while taking this medication.  Please avoid other systemic medications such as Maalox, Pepto-Bismol or milk of magnesia as they can interfere with your body's ability to absorb the antibiotics.   Diflucan (fluconazole): As you may or may not be aware, taking antibiotics can often cause patients to develop a vaginal yeast infection.  For this reason, I have provided you with a prescription for Diflucan.  Please take the first Diflucan tablet on day 3 or 4 of your antibiotic therapy, and take the second Diflucan tablet 3 days later.  You do not need to pick up this prescription or take this medication unless you develop symptoms of vaginal yeast infection including thick, white vaginal discharge and/or vaginal itching.  This prescription has been provided as a Research officer, political party and for your convenience.  Advil, Motrin (ibuprofen): This is a good anti-inflammatory medication which addresses aches, pains and inflammation of the upper airways that causes sinus and nasal congestion as well as in the lower airways which makes your cough feel tight and sometimes burn.  I recommend that you take between 600 to 800 mg every 6-8 hours as needed.      Xylocaine (lidocaine): This is a numbing medication that can be swished for 15 seconds and swallowed.  You can use this every 3 hours while awake to relieve pain in your mouth and throat.  I have sent a prescription for this medication to your pharmacy.   Please follow-up within the next 5-7 days either with your primary care provider or urgent care if your symptoms do not resolve.  If  you do not have a primary care provider, we will assist you in finding one.  Please be sure you also follow-up with a dentist for further evaluation of your gums.  Keep in mind that the antibiotics prescribed today are only a temporary fix and that dental care is a permanent solution.  You may also wish to consider quitting smoking as smoking cigarettes can worsen periodontal disease.   Thank you for visiting urgent care today.  We appreciate the opportunity to participate in your care.

## 2022-06-29 NOTE — ED Triage Notes (Signed)
C/o oral lesions/gum swelling x 2 days. Requesting antibiotic and pain medication

## 2022-12-07 ENCOUNTER — Other Ambulatory Visit (INDEPENDENT_AMBULATORY_CARE_PROVIDER_SITE_OTHER): Payer: Self-pay | Admitting: Primary Care

## 2022-12-07 DIAGNOSIS — E782 Mixed hyperlipidemia: Secondary | ICD-10-CM

## 2023-05-27 ENCOUNTER — Other Ambulatory Visit: Payer: Self-pay

## 2023-05-27 ENCOUNTER — Emergency Department (HOSPITAL_COMMUNITY): Payer: 59

## 2023-05-27 ENCOUNTER — Emergency Department (HOSPITAL_COMMUNITY)
Admission: EM | Admit: 2023-05-27 | Discharge: 2023-05-27 | Disposition: A | Discharge status: Home / Self Care | Payer: 59 | Attending: Emergency Medicine | Admitting: Emergency Medicine

## 2023-05-27 DIAGNOSIS — R000 Tachycardia, unspecified: Secondary | ICD-10-CM | POA: Diagnosis not present

## 2023-05-27 DIAGNOSIS — R1013 Epigastric pain: Secondary | ICD-10-CM | POA: Diagnosis not present

## 2023-05-27 DIAGNOSIS — R079 Chest pain, unspecified: Secondary | ICD-10-CM | POA: Diagnosis present

## 2023-05-27 DIAGNOSIS — K2100 Gastro-esophageal reflux disease with esophagitis, without bleeding: Secondary | ICD-10-CM | POA: Insufficient documentation

## 2023-05-27 DIAGNOSIS — K219 Gastro-esophageal reflux disease without esophagitis: Secondary | ICD-10-CM

## 2023-05-27 DIAGNOSIS — Z79899 Other long term (current) drug therapy: Secondary | ICD-10-CM | POA: Insufficient documentation

## 2023-05-27 LAB — COMPREHENSIVE METABOLIC PANEL
ALT: 42 U/L (ref 0–44)
AST: 30 U/L (ref 15–41)
Albumin: 4.2 g/dL (ref 3.5–5.0)
Alkaline Phosphatase: 65 U/L (ref 38–126)
Anion gap: 12 (ref 5–15)
BUN: 15 mg/dL (ref 8–23)
CO2: 22 mmol/L (ref 22–32)
Calcium: 9.5 mg/dL (ref 8.9–10.3)
Chloride: 103 mmol/L (ref 98–111)
Creatinine, Ser: 0.83 mg/dL (ref 0.44–1.00)
GFR, Estimated: >60 mL/min (ref >60)
Glucose, Bld: 76 mg/dL (ref 70–99)
Potassium: 3.8 mmol/L (ref 3.5–5.1)
Sodium: 137 mmol/L (ref 135–145)
Total Bilirubin: 0.9 mg/dL (ref 0.3–1.2)
Total Protein: 8.3 g/dL — ABNORMAL HIGH (ref 6.5–8.1)

## 2023-05-27 LAB — LIPASE, BLOOD: Lipase: 26 U/L (ref 11–51)

## 2023-05-27 LAB — CBC WITH DIFFERENTIAL/PLATELET
Abs Immature Granulocytes: 0.09 10*3/uL — ABNORMAL HIGH (ref 0.00–0.07)
Basophils Absolute: 0.1 10*3/uL (ref 0.0–0.1)
Basophils Relative: 1 %
Eosinophils Absolute: 0 10*3/uL (ref 0.0–0.5)
Eosinophils Relative: 0 %
HCT: 42.6 % (ref 36.0–46.0)
Hemoglobin: 13.8 g/dL (ref 12.0–15.0)
Immature Granulocytes: 1 %
Lymphocytes Relative: 27 %
Lymphs Abs: 2.2 10*3/uL (ref 0.7–4.0)
MCH: 29.6 pg (ref 26.0–34.0)
MCHC: 32.4 g/dL (ref 30.0–36.0)
MCV: 91.4 fL (ref 80.0–100.0)
Monocytes Absolute: 1.1 10*3/uL — ABNORMAL HIGH (ref 0.1–1.0)
Monocytes Relative: 14 %
Neutro Abs: 4.8 10*3/uL (ref 1.7–7.7)
Neutrophils Relative %: 57 %
Platelets: 236 10*3/uL (ref 150–400)
RBC: 4.66 MIL/uL (ref 3.87–5.11)
RDW: 12.6 % (ref 11.5–15.5)
WBC: 8.3 10*3/uL (ref 4.0–10.5)
nRBC: 0 % (ref 0.0–0.2)

## 2023-05-27 LAB — TROPONIN I (HIGH SENSITIVITY): Troponin I (High Sensitivity): 5 ng/L (ref <18)

## 2023-05-27 MED ORDER — LACTATED RINGERS IV SOLN: INTRAVENOUS | Status: DC

## 2023-05-27 MED ORDER — MORPHINE SULFATE (PF) 4 MG/ML IV SOLN
8.0000 mg | Freq: Once | INTRAVENOUS | Status: AC
  Administered 2023-05-27: 8 mg via INTRAVENOUS
  Filled 2023-05-27: qty 2

## 2023-05-27 MED ORDER — FAMOTIDINE 20 MG PO TABS
40.0000 mg | ORAL_TABLET | Freq: Once | ORAL | Status: AC
  Administered 2023-05-27: 40 mg via ORAL
  Filled 2023-05-27: qty 2

## 2023-05-27 MED ORDER — LACTATED RINGERS IV BOLUS
1000.0000 mL | Freq: Once | INTRAVENOUS | Status: AC
  Administered 2023-05-27: 1000 mL via INTRAVENOUS

## 2023-05-27 MED ORDER — SUCRALFATE 1 G PO TABS: 1.0000 g | ORAL_TABLET | Freq: Four times a day (QID) | ORAL | 0 refills | Status: DC

## 2023-05-27 MED ORDER — SUCRALFATE 1 G PO TABS
1.0000 g | ORAL_TABLET | Freq: Once | ORAL | Status: AC
  Administered 2023-05-27: 1 g via ORAL
  Filled 2023-05-27: qty 1

## 2023-05-27 MED ORDER — PANTOPRAZOLE SODIUM 40 MG PO TBEC: 40.0000 mg | DELAYED_RELEASE_TABLET | Freq: Every day | ORAL | 0 refills | Status: DC

## 2023-05-27 NOTE — ED Triage Notes (Signed)
Pt reports chest pain, abd pain and vomiting since yesterday.

## 2023-05-27 NOTE — ED Provider Notes (Signed)
[facility] DEPARTMENT AT [facility] Provider Note   CSN: [id number] Arrival date & time: 05/27/23  1036     History  Chief Complaint  Patient presents with   Chest Pain    Sheryl Brown is a 62 y.o. female.  62 year old female presents with several days of epigastric pain rating to her left chest.  Has had some nausea and vomiting.  Denies any fever.  Pain is not associated with food.  Does have a history of gastroesophageal reflux.  Patient symptoms are not exertional.  No prior history of same.  No specific treatment use for this.  Symptoms have been persistent.  Her emesis has been nonbilious or bloody.  No diarrhea noted.  No urinary symptoms.       Home Medications Prior to Admission medications   Medication Sig Start Date End Date Taking? Authorizing Provider  amitriptyline (ELAVIL) 50 MG tablet Take 50 mg by mouth at bedtime. 01/11/22   [provider]  amLODipine (NORVASC) 5 MG tablet TAKE 1 TABLET(5 MG) BY MOUTH DAILY Patient taking differently: Take 5 mg by mouth daily. 09/12/21   Grayce Sessions, NP  atorvastatin (LIPITOR) 20 MG tablet Take 1 tablet (20 mg total) by mouth daily. 07/22/21   Grayce Sessions, NP  fluconazole (DIFLUCAN) 150 MG tablet Take 1 tablet today.  Take second tablet 3 days later. 06/29/22   Theadora Rama Scales, PA-C  ibuprofen (ADVIL) 800 MG tablet Take 1 tablet (800 mg total) by mouth every 8 (eight) hours as needed for up to 21 doses for fever, headache, mild pain or moderate pain. 06/29/22   Theadora Rama Scales, PA-C  lidocaine (XYLOCAINE) 2 % solution Use as directed 15 mLs in the mouth or throat every 3 (three) hours as needed for mouth pain (Sore throat). 06/29/22   Theadora Rama Scales, PA-C  naproxen (NAPROSYN) 375 MG tablet Take 1 tablet (375 mg total) by mouth 2 (two) times daily. 12/25/21   Kommor, Madison, MD  omeprazole (PRILOSEC) 40 MG capsule Take 40 mg by mouth daily. 06/29/20   [provider]  PARoxetine (PAXIL) 20 MG tablet Take 20 mg by mouth daily. 10/03/21   [provider]  valsartan (DIOVAN) 160 MG tablet TAKE 1 TABLET(160 MG) BY MOUTH DAILY Patient taking differently: Take 160 mg by mouth 2 (two) times daily. 09/12/21   Grayce Sessions, NP      Allergies    Gabapentin    Review of Systems   Review of Systems  All other systems reviewed and are negative.   Physical Exam Updated Vital Signs BP (!) 169/110 (BP Location: Left Arm)   Pulse (!) 116   Temp 98.7 F (37.1 C) (Oral)   Resp 14   Ht 1.6 m (5\' 3" )   Wt 80 kg   SpO2 100%   BMI 31.24 kg/m  Physical Exam Vitals and nursing note reviewed.  Constitutional:      General: She is not in acute distress.    Appearance: Normal appearance. She is well-developed. She is not toxic-appearing.  HENT:     Head: Normocephalic and atraumatic.  Eyes:     General: Lids are normal.     Conjunctiva/sclera: Conjunctivae normal.     Pupils: Pupils are equal, round, and reactive to light.  Neck:     Thyroid: No thyroid mass.     Trachea: No tracheal deviation.  Cardiovascular:     Rate and Rhythm: Regular rhythm. Tachycardia present.  Heart sounds: Normal heart sounds. No murmur heard.    No gallop.  Pulmonary:     Effort: Pulmonary effort is normal. No respiratory distress.     Breath sounds: Normal breath sounds. No stridor. No decreased breath sounds, wheezing, rhonchi or rales.  Abdominal:     General: There is no distension.     Palpations: Abdomen is soft.     Tenderness: There is abdominal tenderness in the epigastric area. There is guarding. There is no rebound.    Musculoskeletal:        General: No tenderness. Normal range of motion.     Cervical back: Normal range of motion and neck supple.  Skin:    General: Skin is warm and dry.     Findings: No abrasion or rash.  Neurological:     Mental Status: She is alert and oriented to person, place, and time. Mental status is  at baseline.     GCS: GCS eye subscore is 4. GCS verbal subscore is 5. GCS motor subscore is 6.     Cranial Nerves: Cranial nerves are intact. No cranial nerve deficit.     Sensory: No sensory deficit.     Motor: Motor function is intact.  Psychiatric:        Attention and Perception: Attention normal.        Speech: Speech normal.        Behavior: Behavior normal.     ED Results / Procedures / Treatments   Labs (all labs ordered are listed, but only abnormal results are displayed) Labs Reviewed  LIPASE, BLOOD  CBC WITH DIFFERENTIAL/PLATELET  COMPREHENSIVE METABOLIC PANEL  URINALYSIS, ROUTINE W REFLEX MICROSCOPIC  TROPONIN I (HIGH SENSITIVITY)    EKG EKG Interpretation Date/Time:  Tuesday May 27 2023 10:41:21 EDT Ventricular Rate:  109 PR Interval:  124 QRS Duration:  88 QT Interval:  339 QTC Calculation: 457 R Axis:   48  Text Interpretation: Sinus tachycardia Ventricular premature complex Aberrant conduction of SV complex(es) Probable left atrial enlargement Low voltage, precordial leads Baseline wander in lead(s) V2 V3 V4 V5 Confirmed by Lorre Nick ([id number]) on 05/27/2023 11:16:21 AM  Radiology No results found.  Procedures Procedures    Medications Ordered in ED Medications  lactated ringers infusion (has no administration in time range)  lactated ringers bolus 1,000 mL (has no administration in time range)    ED Course/ Medical Decision Making/ A&P                             Medical Decision Making Amount and/or Complexity of Data Reviewed Labs: ordered. Radiology: ordered.  Risk Prescription drug management.   Patient is EKG per my interpretation shows sinus tachycardia.  Suspect this is with patient's pain.  Heart rate improved after being medicated.  Patient troponin negative here to have low suspicion for ACS.  Concern for possible reflux versus possible peptic ulcer disease versus perforation.  Abdominal CT per interpretation showed no acute  findings.  Repeat abdominal exam was benign.  Labs are reassuring here.  Plan will be to discharge and place on PPI and Carafate and give GI referral        Final Clinical Impression(s) / ED Diagnoses Final diagnoses:  None    Rx / DC Orders ED Discharge Orders     None         Lorre Nick, MD 05/27/23 1328

## 2023-05-29 ENCOUNTER — Encounter (HOSPITAL_COMMUNITY): Payer: Self-pay | Admitting: Emergency Medicine

## 2023-05-29 ENCOUNTER — Emergency Department (HOSPITAL_COMMUNITY)
Admission: EM | Admit: 2023-05-29 | Discharge: 2023-05-29 | Disposition: A | Discharge status: Home / Self Care | Payer: 59 | Attending: Emergency Medicine | Admitting: Emergency Medicine

## 2023-05-29 ENCOUNTER — Emergency Department (HOSPITAL_COMMUNITY): Payer: 59

## 2023-05-29 ENCOUNTER — Other Ambulatory Visit: Payer: Self-pay

## 2023-05-29 DIAGNOSIS — F17210 Nicotine dependence, cigarettes, uncomplicated: Secondary | ICD-10-CM | POA: Insufficient documentation

## 2023-05-29 DIAGNOSIS — R112 Nausea with vomiting, unspecified: Secondary | ICD-10-CM | POA: Insufficient documentation

## 2023-05-29 DIAGNOSIS — R519 Headache, unspecified: Secondary | ICD-10-CM | POA: Insufficient documentation

## 2023-05-29 DIAGNOSIS — R1013 Epigastric pain: Secondary | ICD-10-CM | POA: Insufficient documentation

## 2023-05-29 DIAGNOSIS — I10 Essential (primary) hypertension: Secondary | ICD-10-CM | POA: Insufficient documentation

## 2023-05-29 DIAGNOSIS — Z79899 Other long term (current) drug therapy: Secondary | ICD-10-CM | POA: Diagnosis not present

## 2023-05-29 DIAGNOSIS — R109 Unspecified abdominal pain: Secondary | ICD-10-CM | POA: Diagnosis present

## 2023-05-29 LAB — COMPREHENSIVE METABOLIC PANEL
ALT: 36 U/L (ref 0–44)
AST: 29 U/L (ref 15–41)
Albumin: 4.1 g/dL (ref 3.5–5.0)
Alkaline Phosphatase: 59 U/L (ref 38–126)
Anion gap: 11 (ref 5–15)
BUN: 17 mg/dL (ref 8–23)
CO2: 22 mmol/L (ref 22–32)
Calcium: 9.1 mg/dL (ref 8.9–10.3)
Chloride: 102 mmol/L (ref 98–111)
Creatinine, Ser: 0.95 mg/dL (ref 0.44–1.00)
GFR, Estimated: >60 mL/min (ref >60)
Glucose, Bld: 78 mg/dL (ref 70–99)
Potassium: 3.7 mmol/L (ref 3.5–5.1)
Sodium: 135 mmol/L (ref 135–145)
Total Bilirubin: 1 mg/dL (ref 0.3–1.2)
Total Protein: 8.3 g/dL — ABNORMAL HIGH (ref 6.5–8.1)

## 2023-05-29 LAB — CBC
HCT: 42.6 % (ref 36.0–46.0)
Hemoglobin: 13.7 g/dL (ref 12.0–15.0)
MCH: 29.5 pg (ref 26.0–34.0)
MCHC: 32.2 g/dL (ref 30.0–36.0)
MCV: 91.8 fL (ref 80.0–100.0)
Platelets: 214 10*3/uL (ref 150–400)
RBC: 4.64 MIL/uL (ref 3.87–5.11)
RDW: 12.4 % (ref 11.5–15.5)
WBC: 6.5 10*3/uL (ref 4.0–10.5)
nRBC: 0 % (ref 0.0–0.2)

## 2023-05-29 LAB — LIPASE, BLOOD: Lipase: 33 U/L (ref 11–51)

## 2023-05-29 MED ORDER — IOHEXOL 300 MG/ML  SOLN
100.0000 mL | Freq: Once | INTRAMUSCULAR | Status: AC | PRN
  Administered 2023-05-29: 100 mL via INTRAVENOUS

## 2023-05-29 MED ORDER — SODIUM CHLORIDE 0.9 % IV BOLUS
1000.0000 mL | Freq: Once | INTRAVENOUS | Status: AC
  Administered 2023-05-29: 1000 mL via INTRAVENOUS

## 2023-05-29 MED ORDER — MORPHINE SULFATE (PF) 4 MG/ML IV SOLN
4.0000 mg | Freq: Once | INTRAVENOUS | Status: AC
  Administered 2023-05-29: 4 mg via INTRAVENOUS
  Filled 2023-05-29: qty 1

## 2023-05-29 MED ORDER — ONDANSETRON HCL 4 MG/2ML IJ SOLN: 4.0000 mg | Freq: Once | INTRAMUSCULAR | Status: DC

## 2023-05-29 MED ORDER — ONDANSETRON HCL 4 MG/2ML IJ SOLN
4.0000 mg | Freq: Once | INTRAMUSCULAR | Status: AC
  Administered 2023-05-29: 4 mg via INTRAVENOUS
  Filled 2023-05-29: qty 2

## 2023-05-29 MED ORDER — ONDANSETRON 4 MG PO TBDP: 4.0000 mg | ORAL_TABLET | Freq: Three times a day (TID) | ORAL | 0 refills | Status: DC | PRN

## 2023-05-29 NOTE — ED Triage Notes (Signed)
Pt reports n/v and weakness for 3 days. States she has not been able to keep anything down for days. Endorses mid and upper abd pain. Also reports severe headache and dizziness.

## 2023-05-29 NOTE — ED Provider Notes (Signed)
[place] EMERGENCY DEPARTMENT AT [facility] Provider Note  CSN: [id number] Arrival date & time: 05/29/23 7253  Chief Complaint(s) Emesis and Weakness  HPI Sheryl Brown is a 62 y.o. female with history of seizure disorder, hypertension, hyperlipidemia presenting to the emergency department with abdominal pain.  Patient reports abdominal pain located in the epigastric region.  Reports it radiates to her back.  She also reports nonbloody, nonbilious vomiting anytime she tries to eat or drink with associated nausea.  No fevers or chills.  She also reports headache every time she vomits, with some headache at baseline which is mild.  Denies similar headaches in the past.  No neck stiffness.  No vision changes, trouble swallowing, numbness or tingling, weakness.  She was seen in the emergency department 2 days ago, evaluated with labs including CT scan, ultimately discharged, however she reports her symptoms have worsened so she returned to the emergency department.   Past Medical History Past Medical History:  Diagnosis Date   Meningitis 2013   Neuropathy    Seasonal allergies    Seizures (HCC)    Stroke [facility])    Patient Active Problem List   Diagnosis Date Noted   Vitamin D deficiency 09/01/2019   Seizure (HCC) 01/18/2019   TIA (transient ischemic attack) 01/18/2019   HTN (hypertension) 01/28/2018   Hyperlipidemia 06/03/2017   Chronic bilateral low back pain without sciatica 12/12/2016   URI 01/20/2009   TOBACCO ABUSE 12/08/2008   CANDIDIASIS, VAGINAL 10/21/2008   DEPRESSION, SITUATIONAL 05/31/2008   DENTAL PAIN 02/23/2008   ACUTE GINGIVITIS NONPLAQUE INDUCED 12/24/2007   INGUINAL PAIN, RIGHT 12/24/2007   UTI 11/26/2007   ROTATOR CUFF SYNDROME, RIGHT 07/24/2007   GERD 07/23/2007   ELEVATED BLOOD PRESSURE WITHOUT DIAGNOSIS OF HYPERTENSION 05/28/2007   SYMPTOM, PAIN, ABDOMINAL, GENERALIZED 11/25/2006   LABYRINTHITIS, HX OF 09/11/2006   Home Medication(s) Prior  to Admission medications   Medication Sig Start Date End Date Taking? Authorizing Provider  ondansetron (ZOFRAN-ODT) 4 MG disintegrating tablet Take 1 tablet (4 mg total) by mouth every 8 (eight) hours as needed for nausea or vomiting. 05/29/23  Yes Lonell Grandchild, MD  amitriptyline (ELAVIL) 50 MG tablet Take 50 mg by mouth at bedtime. 01/11/22   [provider]  amLODipine (NORVASC) 5 MG tablet TAKE 1 TABLET(5 MG) BY MOUTH DAILY Patient taking differently: Take 5 mg by mouth daily. 09/12/21   Grayce Sessions, NP  atorvastatin (LIPITOR) 20 MG tablet Take 1 tablet (20 mg total) by mouth daily. 07/22/21   Grayce Sessions, NP  fluconazole (DIFLUCAN) 150 MG tablet Take 1 tablet today.  Take second tablet 3 days later. 06/29/22   Theadora Rama Scales, PA-C  ibuprofen (ADVIL) 800 MG tablet Take 1 tablet (800 mg total) by mouth every 8 (eight) hours as needed for up to 21 doses for fever, headache, mild pain or moderate pain. 06/29/22   Theadora Rama Scales, PA-C  lidocaine (XYLOCAINE) 2 % solution Use as directed 15 mLs in the mouth or throat every 3 (three) hours as needed for mouth pain (Sore throat). 06/29/22   Theadora Rama Scales, PA-C  naproxen (NAPROSYN) 375 MG tablet Take 1 tablet (375 mg total) by mouth 2 (two) times daily. 12/25/21   Kommor, Madison, MD  omeprazole (PRILOSEC) 40 MG capsule Take 40 mg by mouth daily. 06/29/20   [provider]  pantoprazole (PROTONIX) 40 MG tablet Take 1 tablet (40 mg total) by mouth daily. 05/27/23   Lorre Nick, MD  PARoxetine (PAXIL) 20 MG tablet Take 20 mg by mouth daily. 10/03/21   [provider]  sucralfate (CARAFATE) 1 g tablet Take 1 tablet (1 g total) by mouth 4 (four) times daily. 05/27/23   Lorre Nick, MD  valsartan (DIOVAN) 160 MG tablet TAKE 1 TABLET(160 MG) BY MOUTH DAILY Patient taking differently: Take 160 mg by mouth 2 (two) times daily. 09/12/21   Grayce Sessions, NP                                                                                                                                     Past Surgical History Past Surgical History:  Procedure Laterality Date   CESAREAN SECTION     Family History Family History  Problem Relation Age of Onset   Heart failure Mother    Cirrhosis Father     Social History Social History   Tobacco Use   Smoking status: Every Day    Current packs/day: 0.25    Types: Cigarettes   Smokeless tobacco: Never  Vaping Use   Vaping status: Never Used  Substance Use Topics   Alcohol use: Yes    Comment: only on holidays   Drug use: Never   Allergies Gabapentin  Review of Systems Review of Systems  All other systems reviewed and are negative.   Physical Exam Vital Signs  I have reviewed the triage vital signs BP (!) 167/98   Pulse 91   Temp 98.2 F (36.8 C) (Oral)   Resp 17   Ht 5\' 3"  (1.6 m)   Wt 80 kg   SpO2 96%   BMI 31.24 kg/m  Physical Exam Vitals and nursing note reviewed.  Constitutional:      General: She is not in acute distress.    Appearance: She is well-developed.  HENT:     Head: Normocephalic and atraumatic.     Mouth/Throat:     Mouth: Mucous membranes are dry.  Eyes:     Extraocular Movements: Extraocular movements intact.     Pupils: Pupils are equal, round, and reactive to light.  Cardiovascular:     Rate and Rhythm: Normal rate and regular rhythm.     Heart sounds: No murmur heard. Pulmonary:     Effort: Pulmonary effort is normal. No respiratory distress.     Breath sounds: Normal breath sounds.  Abdominal:     General: Abdomen is flat.     Palpations: Abdomen is soft.     Tenderness: There is abdominal tenderness (epigastric). There is no right CVA tenderness or left CVA tenderness.  Musculoskeletal:        General: No tenderness.     Right lower leg: No edema.     Left lower leg: No edema.  Skin:    General: Skin is warm and dry.  Neurological:     General: No focal deficit present.      Mental Status: She  is alert and oriented to person, place, and time. Mental status is at baseline.     Cranial Nerves: No cranial nerve deficit.     Motor: No weakness.  Psychiatric:        Mood and Affect: Mood normal.        Behavior: Behavior normal.     ED Results and Treatments Labs (all labs ordered are listed, but only abnormal results are displayed) Labs Reviewed  COMPREHENSIVE METABOLIC PANEL - Abnormal; Notable for the following components:      Result Value   Total Protein 8.3 (*)    All other components within normal limits  LIPASE, BLOOD  CBC  URINALYSIS, ROUTINE W REFLEX MICROSCOPIC                                                                                                                          Radiology CT ABDOMEN PELVIS W CONTRAST  Result Date: 05/29/2023 CLINICAL DATA:  Abdominal pain, nausea, vomiting EXAM: CT ABDOMEN AND PELVIS WITH CONTRAST TECHNIQUE: Multidetector CT imaging of the abdomen and pelvis was performed using the standard protocol following bolus administration of intravenous contrast. RADIATION DOSE REDUCTION: This exam was performed according to the departmental dose-optimization program which includes automated exposure control, adjustment of the mA and/or kV according to patient size and/or use of iterative reconstruction technique. CONTRAST:  OMNIPAQUE IOHEXOL 300 MG/ML  SOLN COMPARISON:  05/27/2023 FINDINGS: Lower chest: Visualized lower lung fields are clear. Hepatobiliary: There is fatty infiltration in liver. There is no dilation of bile ducts. Gallbladder is unremarkable. Pancreas: No focal abnormalities are seen. Spleen: There is 2.6 cm low-density lesion in spleen, possibly hemangioma. Spleen is not enlarged. Adrenals/Urinary Tract: Adrenals are unremarkable. There is no hydronephrosis. There are no renal or ureteral stones. Urinary bladder is unremarkable. Stomach/Bowel: Stomach is unremarkable. Small bowel loops are not dilated.  Appendix is not seen. There are surgical clips in the pericecal region. There is no significant wall thickening in colon. There is no pericolic stranding. Vascular/Lymphatic: Scattered arterial calcifications are seen. Reproductive: There is inhomogeneous attenuation in uterus. There is a 3.1 cm lesion with coarse calcifications in right side of the uterus suggesting fibroid. Other: There is no ascites or pneumoperitoneum. Musculoskeletal: No acute findings are seen. IMPRESSION: There is no evidence of intestinal obstruction or pneumoperitoneum. There is no hydronephrosis. Fatty liver. Possible hemangioma in spleen. Uterine fibroid. Aortic arteriosclerosis. Electronically Signed   By: Ernie Avena M.D.   On: 05/29/2023 11:33   CT Head Wo Contrast  Result Date: 05/29/2023 CLINICAL DATA:  Provided history: Headache, increasing frequency or severity. EXAM: CT HEAD WITHOUT CONTRAST TECHNIQUE: Contiguous axial images were obtained from the base of the skull through the vertex without intravenous contrast. RADIATION DOSE REDUCTION: This exam was performed according to the departmental dose-optimization program which includes automated exposure control, adjustment of the mA and/or kV according to patient size and/or use of iterative reconstruction technique. COMPARISON:  Brain MRI 05/09/2022.  Head CT 05/09/2022. FINDINGS: Brain: Cerebral volume is normal. There is no acute intracranial hemorrhage. No demarcated cortical infarct. No extra-axial fluid collection. No evidence of an intracranial mass. No midline shift. Vascular: No hyperdense vessel.  Atherosclerotic calcifications. Skull: No calvarial fracture or aggressive osseous lesion. Sinuses/Orbits: No mass or acute finding within the imaged orbits. Small fluid level within the right sphenoid sinus. 13 mm mucous retention cyst within the left sphenoid sinus. IMPRESSION: 1.  No evidence of an acute intracranial abnormality. 2. Paranasal sinus disease at the  imaged levels, as described. Electronically Signed   By: Jackey Loge D.O.   On: 05/29/2023 11:30    Pertinent labs & imaging results that were available during my care of the patient were reviewed by me and considered in my medical decision making (see MDM for details).  Medications Ordered in ED Medications  ondansetron (ZOFRAN) injection 4 mg (4 mg Intravenous Not Given 05/29/23 1330)  sodium chloride 0.9 % bolus 1,000 mL (1,000 mLs Intravenous New Bag/Given 05/29/23 1042)  ondansetron (ZOFRAN) injection 4 mg (4 mg Intravenous Given 05/29/23 1041)  morphine (PF) 4 MG/ML injection 4 mg (4 mg Intravenous Given 05/29/23 1041)  iohexol (OMNIPAQUE) 300 MG/ML solution 100 mL (100 mLs Intravenous Contrast Given 05/29/23 1048)                                                                                                                                     Procedures Procedures  (including critical care time)  Medical Decision Making / ED Course   MDM:  62 year old female presenting to the emergency department with abdominal pain.  Patient is overall well-appearing, vital signs reassuring.  Physical exam is notable for epigastric abdominal tenderness.  Also appears mildly dehydrated.  Patient had reassuring CT scan 2 days ago, with no evidence of bowel obstruction, pancreatitis, perforation or other acute process but given worsening symptoms and continued p.o. intolerance will obtain repeat CT scan to evaluate for interval change.  Also recheck labs, patient appears mildly dehydrated so we will give some IV fluids as well as pain control.  She reports new headache, will check CT head to evaluate for acute cranial pathology such as bleeding.  Very low concern for cardiac cause of symptoms, had laboratory testing previously including reassuring troponin, EKG today with no acute change. Will re-assess  Clinical Course as of 05/29/23 1401  Thu May 29, 2023  1359 CT head, CT abdomen without evidence  of acute finding.  Patient does feel better.  She was able to tolerate p.o. without vomiting.  Patient was suspected to have gastritis or reflux on previous ER visit and suspect this is the cause of her symptoms.  Advised follow-up with gastroenterology. Will discharge patient to home. All questions answered. Patient comfortable with plan of discharge. Return precautions discussed with patient and specified on the after visit summary.  [WS]    Clinical Course User Index [  WS] Lonell Grandchild, MD     Additional history obtained: -Additional history obtained from family -External records from outside source obtained and reviewed including: Chart review including previous notes, labs, imaging, consultation notes including recent ER visit for similar   Lab Tests: -I ordered, reviewed, and interpreted labs.   The pertinent results include:   Labs Reviewed  COMPREHENSIVE METABOLIC PANEL - Abnormal; Notable for the following components:      Result Value   Total Protein 8.3 (*)    All other components within normal limits  LIPASE, BLOOD  CBC  URINALYSIS, ROUTINE W REFLEX MICROSCOPIC    Notable for normal lipase, normal LFTs  EKG   EKG Interpretation Date/Time:  Thursday May 29 2023 09:14:02 EDT Ventricular Rate:  95 PR Interval:  117 QRS Duration:  77 QT Interval:  343 QTC Calculation: 432 R Axis:   53  Text Interpretation: Sinus rhythm Borderline short PR interval LAE, consider biatrial enlargement Confirmed by Alvino Blood ([id number]) on 05/29/2023 10:24:40 AM         Imaging Studies ordered: I ordered imaging studies including CT abd, CT head On my interpretation imaging demonstrates no acute process I independently visualized and interpreted imaging. I agree with the radiologist interpretation   Medicines ordered and prescription drug management: Meds ordered this encounter  Medications   sodium chloride 0.9 % bolus 1,000 mL   ondansetron (ZOFRAN) injection 4  mg   morphine (PF) 4 MG/ML injection 4 mg   iohexol (OMNIPAQUE) 300 MG/ML solution 100 mL   ondansetron (ZOFRAN) injection 4 mg   ondansetron (ZOFRAN-ODT) 4 MG disintegrating tablet    Sig: Take 1 tablet (4 mg total) by mouth every 8 (eight) hours as needed for nausea or vomiting.    Dispense:  20 tablet    Refill:  0    -I have reviewed the patients home medicines and have made adjustments as needed   Cardiac Monitoring: The patient was maintained on a cardiac monitor.  I personally viewed and interpreted the cardiac monitored which showed an underlying rhythm of: NSR  Social Determinants of Health:  Diagnosis or treatment significantly limited by social determinants of health: obesity   Reevaluation: After the interventions noted above, I reevaluated the patient and found that their symptoms have improved  Co morbidities that complicate the patient evaluation  Past Medical History:  Diagnosis Date   Meningitis 2013   Neuropathy    Seasonal allergies    Seizures (HCC)    Stroke [facility])       Dispostion: Disposition decision including need for hospitalization was considered, and patient discharged from emergency department.    Final Clinical Impression(s) / ED Diagnoses Final diagnoses:  Epigastric pain     This chart was dictated using voice recognition software.  Despite best efforts to proofread,  errors can occur which can change the documentation meaning.    Lonell Grandchild, MD 05/29/23 1401

## 2023-05-29 NOTE — Discharge Instructions (Addendum)
We evaluated you for your abdominal pain.  Your abdominal pain is most likely due to inflammation in your stomach.  I would recommend following up with gastroenterology.  You can follow-up with [facility] gastroenterology.  We repeated your CT scan today and it did not show any dangerous conditions such as blockage in your intestines or a hole in your intestine.  I prescribed you nausea medicine which you can take at home.  This medicine can dissolve under your tongue.  You can take it every 8 hours as needed for nausea or vomiting.  If your symptoms are uncontrolled at home and you have ongoing vomiting, severe pain, weakness, fainting, or any other concerning symptoms, please return to the emergency department.

## 2023-06-02 ENCOUNTER — Other Ambulatory Visit (HOSPITAL_BASED_OUTPATIENT_CLINIC_OR_DEPARTMENT_OTHER): Payer: Self-pay

## 2023-06-02 ENCOUNTER — Encounter (HOSPITAL_BASED_OUTPATIENT_CLINIC_OR_DEPARTMENT_OTHER): Payer: Self-pay

## 2023-06-02 ENCOUNTER — Emergency Department (HOSPITAL_BASED_OUTPATIENT_CLINIC_OR_DEPARTMENT_OTHER)
Admission: EM | Admit: 2023-06-02 | Discharge: 2023-06-02 | Disposition: A | Discharge status: Home / Self Care | Payer: 59 | Attending: Emergency Medicine | Admitting: Emergency Medicine

## 2023-06-02 ENCOUNTER — Other Ambulatory Visit: Payer: Self-pay

## 2023-06-02 DIAGNOSIS — K2970 Gastritis, unspecified, without bleeding: Secondary | ICD-10-CM | POA: Insufficient documentation

## 2023-06-02 DIAGNOSIS — R1013 Epigastric pain: Secondary | ICD-10-CM

## 2023-06-02 LAB — COMPREHENSIVE METABOLIC PANEL
ALT: 23 U/L (ref 0–44)
AST: 24 U/L (ref 15–41)
Albumin: 4.3 g/dL (ref 3.5–5.0)
Alkaline Phosphatase: 53 U/L (ref 38–126)
Anion gap: 12 (ref 5–15)
BUN: 14 mg/dL (ref 8–23)
CO2: 21 mmol/L — ABNORMAL LOW (ref 22–32)
Calcium: 9.9 mg/dL (ref 8.9–10.3)
Chloride: 104 mmol/L (ref 98–111)
Creatinine, Ser: 0.98 mg/dL (ref 0.44–1.00)
GFR, Estimated: >60 mL/min (ref >60)
Glucose, Bld: 74 mg/dL (ref 70–99)
Potassium: 4.7 mmol/L (ref 3.5–5.1)
Sodium: 137 mmol/L (ref 135–145)
Total Bilirubin: 0.6 mg/dL (ref 0.3–1.2)
Total Protein: 8.1 g/dL (ref 6.5–8.1)

## 2023-06-02 LAB — CBC
HCT: 42.4 % (ref 36.0–46.0)
Hemoglobin: 13.7 g/dL (ref 12.0–15.0)
MCH: 29.7 pg (ref 26.0–34.0)
MCHC: 32.3 g/dL (ref 30.0–36.0)
MCV: 92 fL (ref 80.0–100.0)
Platelets: 220 10*3/uL (ref 150–400)
RBC: 4.61 MIL/uL (ref 3.87–5.11)
RDW: 12.1 % (ref 11.5–15.5)
WBC: 7.9 10*3/uL (ref 4.0–10.5)
nRBC: 0 % (ref 0.0–0.2)

## 2023-06-02 LAB — LIPASE, BLOOD: Lipase: 40 U/L (ref 11–51)

## 2023-06-02 MED ORDER — PANTOPRAZOLE SODIUM 40 MG IV SOLR
40.0000 mg | Freq: Once | INTRAVENOUS | Status: AC
  Administered 2023-06-02: 40 mg via INTRAVENOUS
  Filled 2023-06-02: qty 10

## 2023-06-02 MED ORDER — POLYETHYLENE GLYCOL 3350 17 GM/SCOOP PO POWD
17.0000 g | Freq: Every day | ORAL | 0 refills | Status: DC
Start: 2023-06-02 — End: 2024-03-28
  Filled 2023-06-02: qty 238, 14d supply, fill #0
  Filled 2023-06-02: qty 14, 14d supply, fill #0

## 2023-06-02 MED ORDER — PANTOPRAZOLE SODIUM 40 MG PO TBEC
40.0000 mg | DELAYED_RELEASE_TABLET | Freq: Two times a day (BID) | ORAL | 0 refills | Status: DC
  Filled 2023-06-02 (×2): qty 60, 30d supply, fill #0

## 2023-06-02 MED ORDER — LACTATED RINGERS IV BOLUS
1000.0000 mL | Freq: Once | INTRAVENOUS | Status: AC
  Administered 2023-06-02: 1000 mL via INTRAVENOUS

## 2023-06-02 MED ORDER — LIDOCAINE VISCOUS HCL 2 % MT SOLN
15.0000 mL | Freq: Once | OROMUCOSAL | Status: AC
  Administered 2023-06-02: 15 mL via ORAL
  Filled 2023-06-02: qty 15

## 2023-06-02 MED ORDER — LACTULOSE 10 GM/15ML PO SOLN
10.0000 g | Freq: Once | ORAL | Status: AC
  Administered 2023-06-02: 10 g via ORAL
  Filled 2023-06-02: qty 30

## 2023-06-02 MED ORDER — ALUM & MAG HYDROXIDE-SIMETH 200-200-20 MG/5ML PO SUSP
30.0000 mL | Freq: Once | ORAL | Status: AC
  Administered 2023-06-02: 30 mL via ORAL
  Filled 2023-06-02: qty 30

## 2023-06-02 NOTE — Discharge Instructions (Addendum)
Your workup today was reassuring.  You received fluids, medications to help coat the lining of your stomach.  I have listed the gastroenterologist you can establish care with.  You can also try your sisters gastroenterologist at [facility].  I recommend an aggressive bowel regimen.  Take 3 capfuls of MiraLAX daily along with Senokot or Dulcolax until you have the desired effect on your stools.  Then decrease the MiraLAX to once daily.  For any concerning symptoms return to the emergency room otherwise please call and follow-up with your primary care provider.  We increased your Protonix to twice daily.

## 2023-06-02 NOTE — ED Notes (Signed)
Reviewed AVS/discharge instruction with patient. Time allotted for and all questions answered. Patient is agreeable for d/c and escorted to ed exit by staff.  

## 2023-06-02 NOTE — ED Provider Notes (Signed)
EMERGENCY DEPARTMENT AT [facility] Provider Note   CSN: [id number] Arrival date & time: 06/02/23  1202     History  Chief Complaint  Patient presents with   Constipation    Sheryl Brown is a 62 y.o. female.  62 year old female presents today for evaluation of epigastric abdominal pain.  This is her third visit for similar complaint.  Has had 2 CT scans in this timeframe which have showed no acute concerns.  She reports constipation but has not tried any stool softeners.  Reports nonbloody nonbilious emesis.  Has not noted any blood in stools.  States that she has not been able to schedule an appointment yet with the gastroenterologist she was referred with.  Sister at bedside states she will try to get her in with her gastroenterologist at [facility].  The history is provided by the patient. No language interpreter was used.       Home Medications Prior to Admission medications   Medication Sig Start Date End Date Taking? Authorizing Provider  amitriptyline (ELAVIL) 50 MG tablet Take 50 mg by mouth at bedtime. 01/11/22   [provider]  amLODipine (NORVASC) 5 MG tablet TAKE 1 TABLET(5 MG) BY MOUTH DAILY Patient taking differently: Take 5 mg by mouth daily. 09/12/21   Grayce Sessions, NP  atorvastatin (LIPITOR) 20 MG tablet Take 1 tablet (20 mg total) by mouth daily. 07/22/21   Grayce Sessions, NP  fluconazole (DIFLUCAN) 150 MG tablet Take 1 tablet today.  Take second tablet 3 days later. 06/29/22   Theadora Rama Scales, PA-C  ibuprofen (ADVIL) 800 MG tablet Take 1 tablet (800 mg total) by mouth every 8 (eight) hours as needed for up to 21 doses for fever, headache, mild pain or moderate pain. 06/29/22   Theadora Rama Scales, PA-C  lidocaine (XYLOCAINE) 2 % solution Use as directed 15 mLs in the mouth or throat every 3 (three) hours as needed for mouth pain (Sore throat). 06/29/22   Theadora Rama Scales, PA-C  naproxen (NAPROSYN) 375 MG  tablet Take 1 tablet (375 mg total) by mouth 2 (two) times daily. 12/25/21   Kommor, [place], MD  omeprazole (PRILOSEC) 40 MG capsule Take 40 mg by mouth daily. 06/29/20   [provider]  ondansetron (ZOFRAN-ODT) 4 MG disintegrating tablet Take 1 tablet (4 mg total) by mouth every 8 (eight) hours as needed for nausea or vomiting. 05/29/23   Lonell Grandchild, MD  pantoprazole (PROTONIX) 40 MG tablet Take 1 tablet (40 mg total) by mouth daily. 05/27/23   Lorre Nick, MD  PARoxetine (PAXIL) 20 MG tablet Take 20 mg by mouth daily. 10/03/21   [provider]  sucralfate (CARAFATE) 1 g tablet Take 1 tablet (1 g total) by mouth 4 (four) times daily. 05/27/23   Lorre Nick, MD  valsartan (DIOVAN) 160 MG tablet TAKE 1 TABLET(160 MG) BY MOUTH DAILY Patient taking differently: Take 160 mg by mouth 2 (two) times daily. 09/12/21   Grayce Sessions, NP      Allergies    Gabapentin    Review of Systems   Review of Systems  Constitutional:  Negative for chills and fever.  Gastrointestinal:  Positive for abdominal pain, constipation, nausea and vomiting.  Genitourinary:  Negative for dysuria.  Neurological:  Negative for light-headedness.  All other systems reviewed and are negative.   Physical Exam Updated Vital Signs BP 120/85 (BP Location: Right Arm)   Pulse 90   Temp 98.1 F (36.7  C) (Oral)   Resp 18   Ht 5\' 3"  (1.6 m)   Wt 80 kg   SpO2 100%   BMI 31.24 kg/m  Physical Exam Vitals and nursing note reviewed.  Constitutional:      General: She is not in acute distress.    Appearance: Normal appearance. She is not ill-appearing.  HENT:     Head: Normocephalic and atraumatic.     Nose: Nose normal.  Eyes:     General: No scleral icterus.    Extraocular Movements: Extraocular movements intact.     Conjunctiva/sclera: Conjunctivae normal.  Cardiovascular:     Rate and Rhythm: Normal rate and regular rhythm.     Heart sounds: Normal heart sounds.  Pulmonary:      Effort: Pulmonary effort is normal. No respiratory distress.     Breath sounds: Normal breath sounds. No wheezing or rales.  Abdominal:     General: There is no distension.     Palpations: Abdomen is soft.     Tenderness: There is no abdominal tenderness. There is no guarding.  Musculoskeletal:        General: Normal range of motion.     Cervical back: Normal range of motion.  Skin:    General: Skin is warm and dry.  Neurological:     General: No focal deficit present.     Mental Status: She is alert. Mental status is at baseline.     ED Results / Procedures / Treatments   Labs (all labs ordered are listed, but only abnormal results are displayed) Labs Reviewed  COMPREHENSIVE METABOLIC PANEL - Abnormal; Notable for the following components:      Result Value   CO2 21 (*)    All other components within normal limits  LIPASE, BLOOD  CBC  URINALYSIS, ROUTINE W REFLEX MICROSCOPIC    EKG None  Radiology No results found.  Procedures Procedures    Medications Ordered in ED Medications  pantoprazole (PROTONIX) injection 40 mg (40 mg Intravenous Given 06/02/23 1328)  alum & mag hydroxide-simeth (MAALOX/MYLANTA) 200-200-20 MG/5ML suspension 30 mL (30 mLs Oral Given 06/02/23 1324)    And  lidocaine (XYLOCAINE) 2 % viscous mouth solution 15 mL (15 mLs Oral Given 06/02/23 1324)  lactulose (CHRONULAC) 10 GM/15ML solution 10 g (10 g Oral Given 06/02/23 1326)  lactated ringers bolus 1,000 mL (1,000 mLs Intravenous New Bag/Given 06/02/23 1331)    ED Course/ Medical Decision Making/ A&P                             Medical Decision Making Amount and/or Complexity of Data Reviewed Labs: ordered.  Risk OTC drugs. Prescription drug management.   62 year old female presents today for evaluation of epigastric abdominal pain.  Unable to get in with the gastroenterologist.  Seen for similar complaints in the past week and a half.  Has had multiple CT scans without any concerning  findings.  Also concerned about constipation.  Has not taken any stool softeners.  She has been drinking prune juice though.  Hemodynamically stable.  Denies melanotic stools, or hematemesis.  Recently prescribed Protonix.  Will increase this to twice daily.  CBC, CMP without acute concerns.  Lipase within normal limit.  Abdominal exam without evidence of acute abdomen.  Given recent imaging we will defer repeat imaging today.  GI cocktail, IV Protonix, fluid bolus given in the emergency department.  Feel she is appropriate for discharge with close follow-up  with gastroenterology and primary care provider.  They are in agreement with this plan.  Patient discharged in stable condition.  No additional workup or admission indicated.  Aggressive bowel regimen discussed.   Final Clinical Impression(s) / ED Diagnoses Final diagnoses:  Epigastric abdominal pain  Gastritis, presence of bleeding unspecified, unspecified chronicity, unspecified gastritis type    Rx / DC Orders ED Discharge Orders          Ordered    pantoprazole (PROTONIX) 40 MG tablet  2 times daily        06/02/23 1433    polyethylene glycol (MIRALAX) 17 g packet  Daily        06/02/23 1435              Marita Kansas, PA-C 06/02/23 1508    Cathren Laine, MD 06/02/23 1527

## 2023-06-02 NOTE — ED Notes (Signed)
Report given to the next RN... 

## 2023-06-02 NOTE — ED Triage Notes (Signed)
Patient here POV from Home.  Endorses N/V Constipation for 8 Days. Seen for same at the ED a few times recently. No Known Fevers or Diarrhea. Noted some bloating as well.   NAD Noted during Triage. A&Ox4. Gcs 15. Ambulatory.

## 2023-08-21 ENCOUNTER — Emergency Department (HOSPITAL_COMMUNITY)
Admission: EM | Admit: 2023-08-21 | Discharge: 2023-08-22 | Discharge status: Against Medical Advice | Payer: 59 | Attending: Emergency Medicine | Admitting: Emergency Medicine

## 2023-08-21 ENCOUNTER — Other Ambulatory Visit: Payer: Self-pay

## 2023-08-21 ENCOUNTER — Emergency Department (HOSPITAL_COMMUNITY): Payer: 59

## 2023-08-21 DIAGNOSIS — M25551 Pain in right hip: Secondary | ICD-10-CM | POA: Insufficient documentation

## 2023-08-21 DIAGNOSIS — R519 Headache, unspecified: Secondary | ICD-10-CM | POA: Diagnosis not present

## 2023-08-21 DIAGNOSIS — Y92410 Unspecified street and highway as the place of occurrence of the external cause: Secondary | ICD-10-CM | POA: Diagnosis not present

## 2023-08-21 DIAGNOSIS — M546 Pain in thoracic spine: Secondary | ICD-10-CM | POA: Insufficient documentation

## 2023-08-21 DIAGNOSIS — M25512 Pain in left shoulder: Secondary | ICD-10-CM | POA: Diagnosis not present

## 2023-08-21 DIAGNOSIS — M25552 Pain in left hip: Secondary | ICD-10-CM | POA: Diagnosis not present

## 2023-08-21 DIAGNOSIS — M25511 Pain in right shoulder: Secondary | ICD-10-CM | POA: Insufficient documentation
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
## 2023-08-22 NOTE — ED Notes (Signed)
No answer for room assignment

## 2023-08-28 ENCOUNTER — Encounter (HOSPITAL_COMMUNITY): Payer: Self-pay | Admitting: Emergency Medicine

## 2023-08-28 ENCOUNTER — Ambulatory Visit (HOSPITAL_COMMUNITY)
Admission: EM | Admit: 2023-08-28 | Discharge: 2023-08-28 | Disposition: A | Discharge status: Home / Self Care | Payer: 59 | Attending: Emergency Medicine | Admitting: Emergency Medicine

## 2023-08-28 DIAGNOSIS — K0889 Other specified disorders of teeth and supporting structures: Secondary | ICD-10-CM

## 2023-08-28 DIAGNOSIS — R42 Dizziness and giddiness: Secondary | ICD-10-CM

## 2023-08-28 MED ORDER — KETOROLAC TROMETHAMINE 30 MG/ML IJ SOLN
INTRAMUSCULAR | Status: AC
  Filled 2023-08-28: qty 1

## 2023-08-28 MED ORDER — AMOXICILLIN-POT CLAVULANATE 875-125 MG PO TABS: 1.0000 | ORAL_TABLET | Freq: Two times a day (BID) | ORAL | 0 refills | Status: DC

## 2023-08-28 MED ORDER — KETOROLAC TROMETHAMINE 30 MG/ML IJ SOLN
30.0000 mg | Freq: Once | INTRAMUSCULAR | Status: AC
  Administered 2023-08-28: 30 mg via INTRAMUSCULAR

## 2023-08-28 MED ORDER — FLUCONAZOLE 150 MG PO TABS: 150.0000 mg | ORAL_TABLET | Freq: Every day | ORAL | 0 refills | Status: DC
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
## 2023-08-28 NOTE — Discharge Instructions (Addendum)
Take all antibiotics as prescribed and until finished, you can take them with food to prevent gastrointestinal upset.  Starting tonight or tomorrow you can take 800 mg of ibuprofen with food up to 3 times daily to help with the pain and inflammation.  Please follow-up with a dentist or oral surgeon for further evaluation of your dentition.  Below are some dental resources.  Return to clinic for any new or urgent symptoms.  [facility] in [place] Address: [address] [place], [place], [place] [address] Phone: [phone]  [facility] [phone] extension [phone] [address]  Dr. Lawrence Marseilles [phone] [address]  [place] [phone] [address] [facility] [place].  [facility] [phone] extension [phone], [place], [place], [address] First come first serve for the first 10 clients.  May do simple extractions only, no wisdom teeth or surgery.  You may try the second for Thursday of the month starting at 6:30 AM.  [facility] You may call the school to see if they are still helping to provide dental care for emergent cases.

## 2023-08-28 NOTE — ED Triage Notes (Signed)
Pt present dental pain on right upper mouth. That started two days ago and is getting worse.

## 2023-10-07 ENCOUNTER — Encounter: Payer: Self-pay | Admitting: Neurology

## 2023-10-07 ENCOUNTER — Ambulatory Visit (INDEPENDENT_AMBULATORY_CARE_PROVIDER_SITE_OTHER): Payer: 59 | Admitting: Neurology

## 2023-10-07 VITALS — BP 165/109 | HR 86 | Ht 60.0 in | Wt 175.5 lb

## 2023-10-07 DIAGNOSIS — Z87898 Personal history of other specified conditions: Secondary | ICD-10-CM | POA: Diagnosis not present

## 2023-10-07 DIAGNOSIS — R519 Headache, unspecified: Secondary | ICD-10-CM

## 2023-10-07 MED ORDER — LAMOTRIGINE 150 MG PO TABS: 150.0000 mg | ORAL_TABLET | Freq: Every day | ORAL | 2 refills | Status: DC

## 2023-10-07 MED ORDER — GABAPENTIN 100 MG PO CAPS
100.0000 mg | ORAL_CAPSULE | Freq: Every day | ORAL | 0 refills | Status: DC
Start: 2023-10-07 — End: 2023-11-10
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
## 2023-10-07 NOTE — Patient Instructions (Addendum)
Increase Lamotrigine 150 mg and take it in the morning, not at night  Trial of Gabapentin 100 mg at night  Continue your other medications  Follow up in 6 months

## 2023-10-29 ENCOUNTER — Telehealth: Payer: Self-pay | Admitting: Neurology

## 2023-10-29 ENCOUNTER — Other Ambulatory Visit: Payer: Self-pay | Admitting: Neurology

## 2023-10-29 ENCOUNTER — Encounter: Payer: Self-pay | Admitting: Neurology

## 2023-10-29 ENCOUNTER — Telehealth: Payer: Self-pay

## 2023-10-29 MED ORDER — SUMATRIPTAN SUCCINATE 50 MG PO TABS: 50.0000 mg | ORAL_TABLET | ORAL | 0 refills | Status: DC | PRN

## 2023-10-29 NOTE — Telephone Encounter (Signed)
Call to patient, she reports that the gabapentin is helping her sleep but she is having terrible headaches still. She also reports "passing out" and waking up in her own vomit on 10/23/23 and did not make [place] aware until now. She is currently taking and reports compliance with lamotrigine, amitriptyline, and gabapentin. She is wanting something for her headaches and is unsure if the passing out was a seizure. She saw neurology in October for seizure but states that was a one time visit.  Advised I would let provider know and follow up with recommendations.

## 2023-10-29 NOTE — Telephone Encounter (Signed)
Pt called to retrieve her Dr's name

## 2023-10-29 NOTE — Telephone Encounter (Signed)
Review of chart shows that she has a history of vomiting and is seeing Gastro. She can try Excedrin Migraines for her headaches and I will give her a triptan.

## 2023-10-29 NOTE — Telephone Encounter (Signed)
Call to patient, reviewed Dr. Teresa Coombs recommendations and patient in agreement to try sumatriptan. She states she has gastro appointment coming up. She was appreciative of call.

## 2023-11-09 ENCOUNTER — Other Ambulatory Visit: Payer: Self-pay | Admitting: Neurology

## 2023-11-25 ENCOUNTER — Encounter: Payer: Self-pay | Admitting: Rehabilitative and Restorative Service Providers"

## 2023-11-25 ENCOUNTER — Other Ambulatory Visit: Payer: Self-pay

## 2023-11-25 ENCOUNTER — Ambulatory Visit: Payer: 59 | Attending: Sports Medicine | Admitting: Rehabilitative and Restorative Service Providers"

## 2023-11-25 DIAGNOSIS — M7061 Trochanteric bursitis, right hip: Secondary | ICD-10-CM | POA: Insufficient documentation

## 2023-11-25 DIAGNOSIS — M5416 Radiculopathy, lumbar region: Secondary | ICD-10-CM | POA: Diagnosis not present

## 2023-11-25 DIAGNOSIS — M6281 Muscle weakness (generalized): Secondary | ICD-10-CM | POA: Insufficient documentation

## 2023-11-25 DIAGNOSIS — M5459 Other low back pain: Secondary | ICD-10-CM | POA: Insufficient documentation

## 2023-11-25 DIAGNOSIS — R252 Cramp and spasm: Secondary | ICD-10-CM

## 2023-11-25 NOTE — Patient Instructions (Signed)
   [facility] Welcome to [facility]! Here you will find all the information you will need regarding your pool therapy. If you have further questions at any time, please call our office at [phone]. After completing your initial evaluation in the [facility] clinic, you may be eligible to complete a portion of your therapy in the pool. A typical week of therapy will consist of 1-2 typical physical therapy visits at our [facility] location and an additional session of therapy in the pool located at the [facility] at [facility]. [address] [place] [address]. The phone number at the pool site is [phone]. Please call this number if you are running late or need to cancel your appointment.  Aquatic therapy will be offered on Wednesday mornings and Friday afternoons. Each session will last approximately 45 minutes. All scheduling and payments for aquatic therapy sessions, including cancelations, will be done through our [facility] location.  To be eligible for aquatic therapy, these criteria must be met: You must be able to independently change in the locker room and get to the pool deck. A caregiver can come with you to help if needed. There are benches for a caregiver to sit on next to the pool. No one with an open wound is permitted in the pool.  Handicap parking is available in the front and there is a drop off option for even closer accessibility. Please arrive 15 minutes prior to your appointment to prepare for your pool session. You must sign in at the front desk upon your arrival. Please be sure to attend to any toileting needs prior to entering the pool. Locker rooms for changing are available.  There is direct access to the pool deck from the locker room. You can lock your belongings in a locker or bring them with you poolside. Your therapist will greet you on the pool deck. There may be other swimmers in the pool at the  same time but your session is one-on-one with the therapist.
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
## 2023-11-28 ENCOUNTER — Ambulatory Visit: Payer: 59 | Admitting: Rehabilitative and Restorative Service Providers"

## 2023-12-03 ENCOUNTER — Ambulatory Visit: Payer: 59 | Admitting: Physical Therapy

## 2023-12-05 ENCOUNTER — Encounter: Payer: 59 | Admitting: Rehabilitative and Restorative Service Providers"

## 2023-12-07 ENCOUNTER — Other Ambulatory Visit: Payer: Self-pay | Admitting: Neurology

## 2023-12-08 ENCOUNTER — Ambulatory Visit: Payer: 59 | Admitting: Rehabilitative and Restorative Service Providers"

## 2023-12-08 ENCOUNTER — Encounter: Payer: Self-pay | Admitting: Rehabilitative and Restorative Service Providers"

## 2023-12-08 DIAGNOSIS — R252 Cramp and spasm: Secondary | ICD-10-CM

## 2023-12-08 DIAGNOSIS — M5459 Other low back pain: Secondary | ICD-10-CM

## 2023-12-08 DIAGNOSIS — M6281 Muscle weakness (generalized): Secondary | ICD-10-CM

## 2023-12-08 DIAGNOSIS — M5416 Radiculopathy, lumbar region: Secondary | ICD-10-CM | POA: Diagnosis not present
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
## 2023-12-10 ENCOUNTER — Ambulatory Visit: Payer: 59 | Admitting: Physical Therapy

## 2023-12-10 ENCOUNTER — Encounter: Payer: Self-pay | Admitting: Physical Therapy

## 2023-12-10 DIAGNOSIS — M5416 Radiculopathy, lumbar region: Secondary | ICD-10-CM | POA: Diagnosis not present

## 2023-12-10 DIAGNOSIS — M5459 Other low back pain: Secondary | ICD-10-CM

## 2023-12-10 DIAGNOSIS — M6281 Muscle weakness (generalized): Secondary | ICD-10-CM

## 2023-12-10 DIAGNOSIS — R252 Cramp and spasm: Secondary | ICD-10-CM

## 2023-12-12 ENCOUNTER — Other Ambulatory Visit: Payer: Self-pay | Admitting: Neurology

## 2023-12-15 ENCOUNTER — Ambulatory Visit: Payer: 59 | Admitting: Rehabilitative and Restorative Service Providers"
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
## 2023-12-17 ENCOUNTER — Ambulatory Visit: Payer: 59 | Attending: Sports Medicine | Admitting: Physical Therapy

## 2023-12-17 DIAGNOSIS — M5459 Other low back pain: Secondary | ICD-10-CM | POA: Insufficient documentation

## 2023-12-17 DIAGNOSIS — M6281 Muscle weakness (generalized): Secondary | ICD-10-CM | POA: Insufficient documentation

## 2023-12-17 DIAGNOSIS — R252 Cramp and spasm: Secondary | ICD-10-CM | POA: Insufficient documentation

## 2023-12-20 IMAGING — CT CT HEAD W/O CM
3 series · 16 of 44 positions shown, 19 images · non-contrast
Comparison: 01/11/2007, correlation is also made with 05/24/2021
MRI

CLINICAL DATA: Facial paralysis/weakness (CN 7), facial numbness



[Series 2: head wo · axial · 0.47mm/px · z∈[-125,-15]mm · 10 of 27 slices shown, 13 images]
[im 3/27  brain]
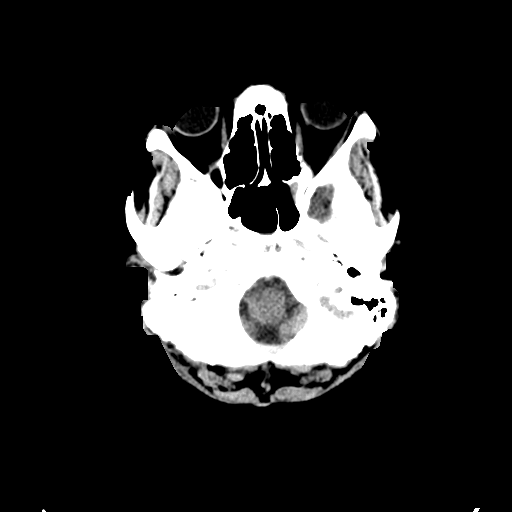
[im 3/27  bone]
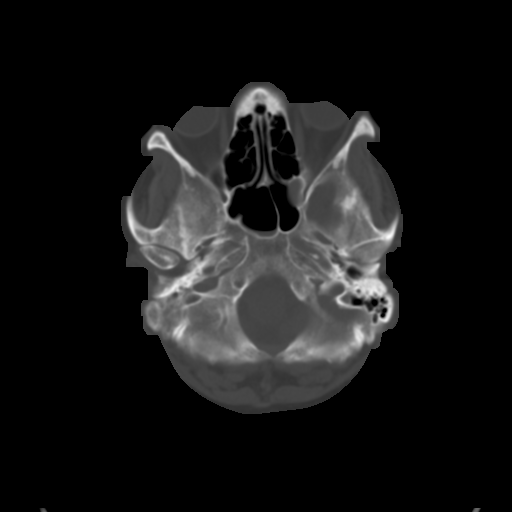
[im 5/27  brain]
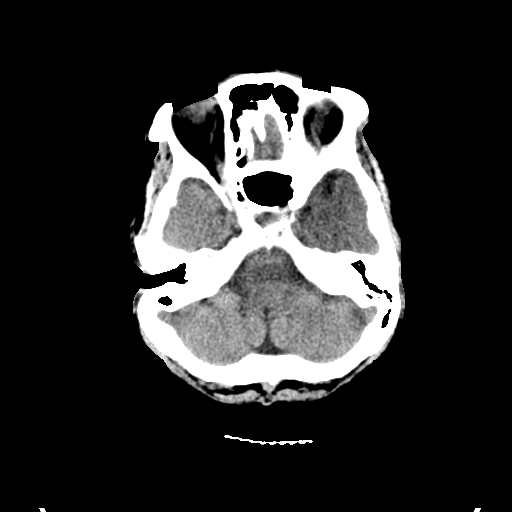
[im 8/27  brain]
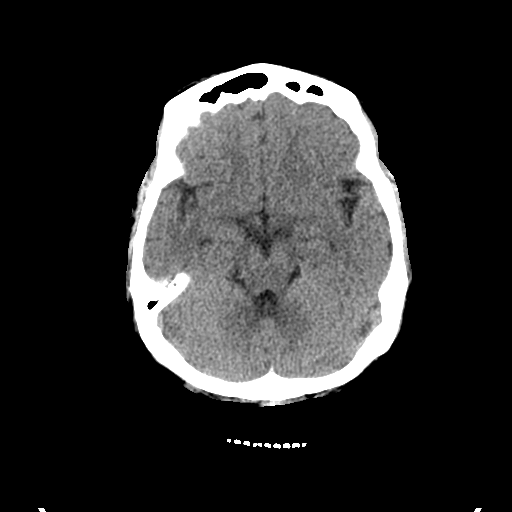
[im 10/27  brain]
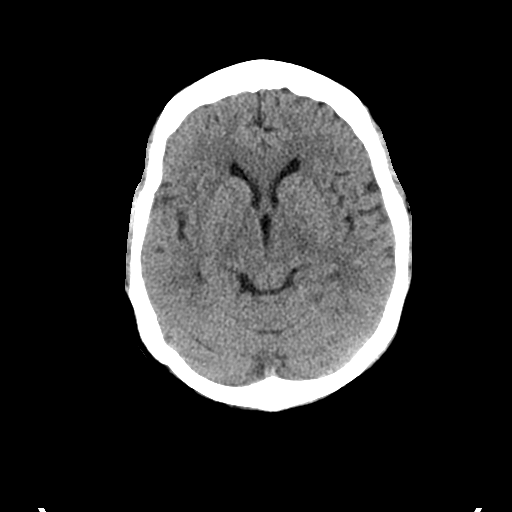
[im 13/27  brain]
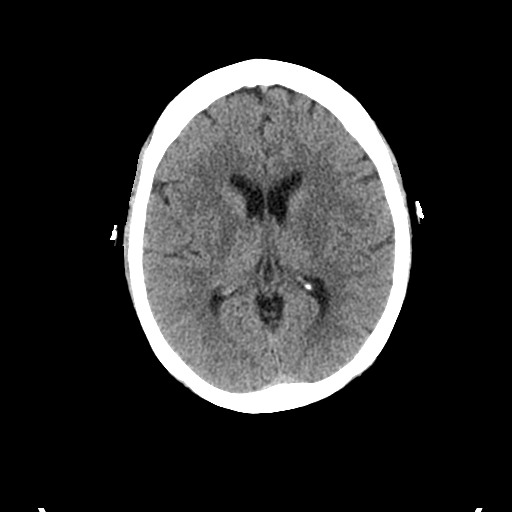
[im 13/27  bone]
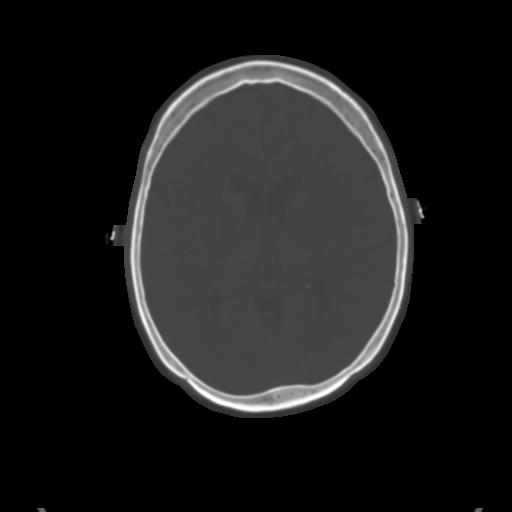
[im 15/27  brain]
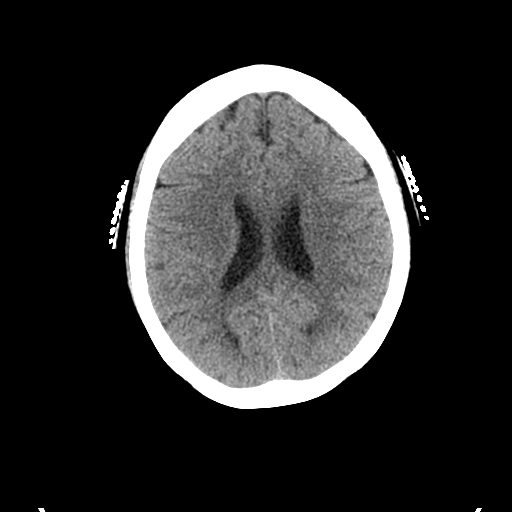
[im 18/27  brain]
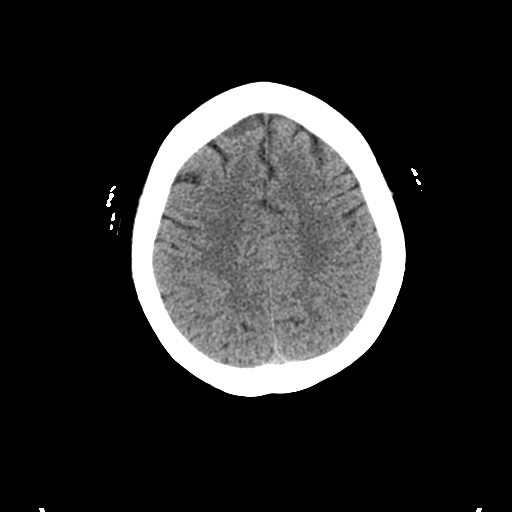
[im 20/27  brain]
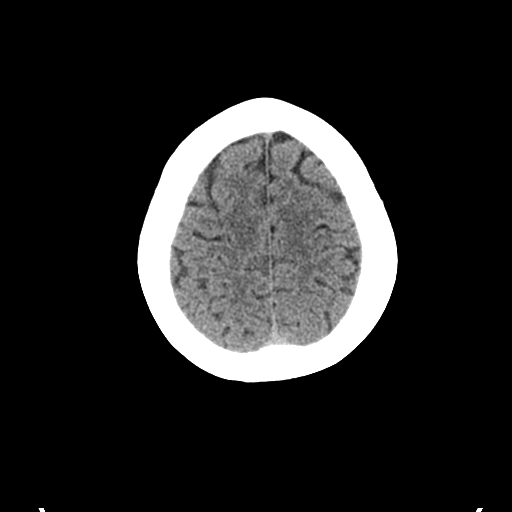
[im 23/27  brain]
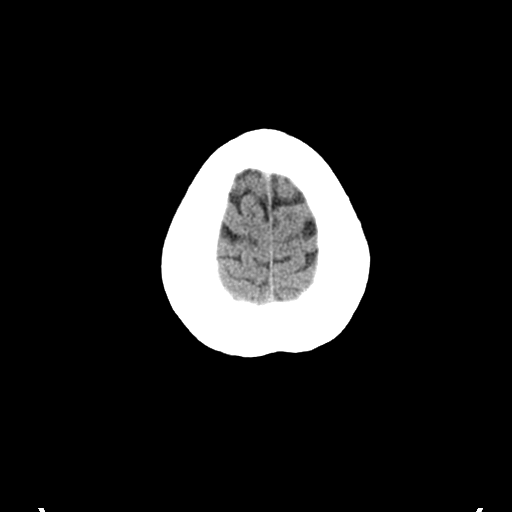
[im 23/27  bone]
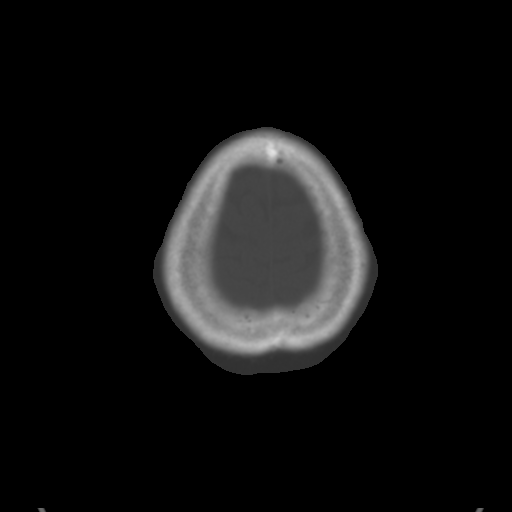
[im 25/27  brain]
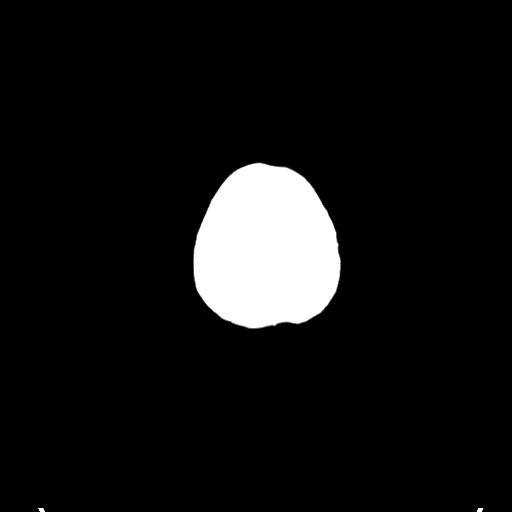

[Series 5: coronal soft tissue · coronal · 0.28mm/px · 3 of 63 slices shown]
[im 21/63  brain]
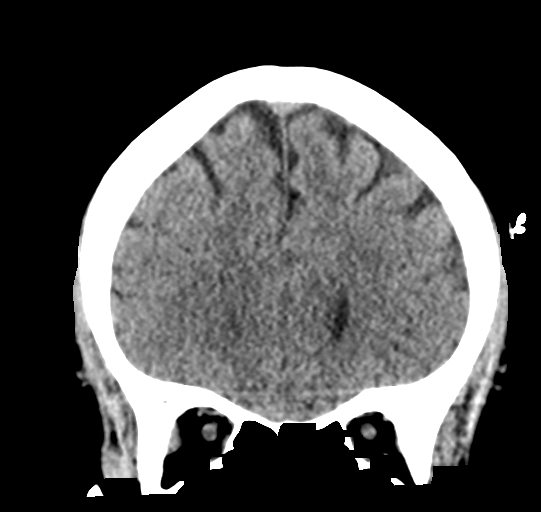
[im 28/63  brain]
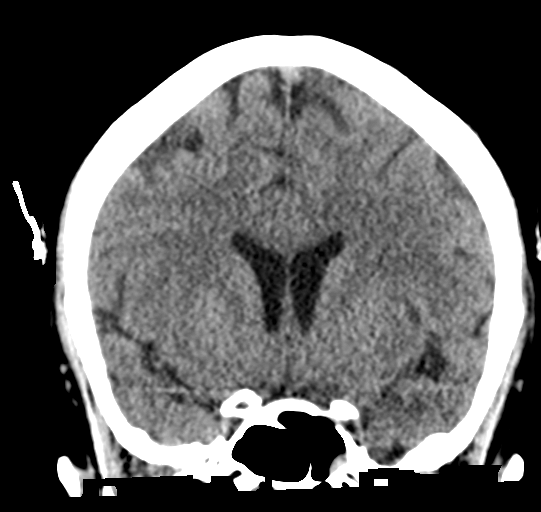
[im 35/63  brain]
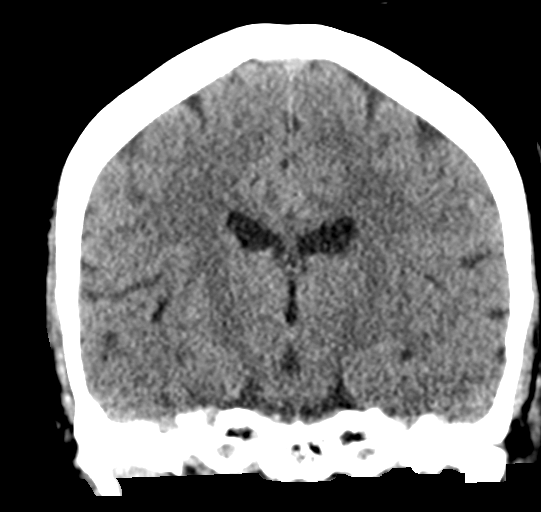

[Series 6: sagittal soft tissue · sagittal · 0.31mm/px · 3 of 55 slices shown]
[im 19/55  brain]
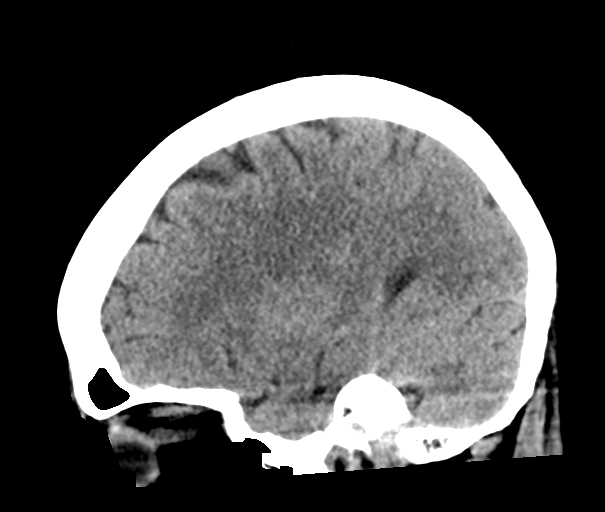
[im 28/55  brain]
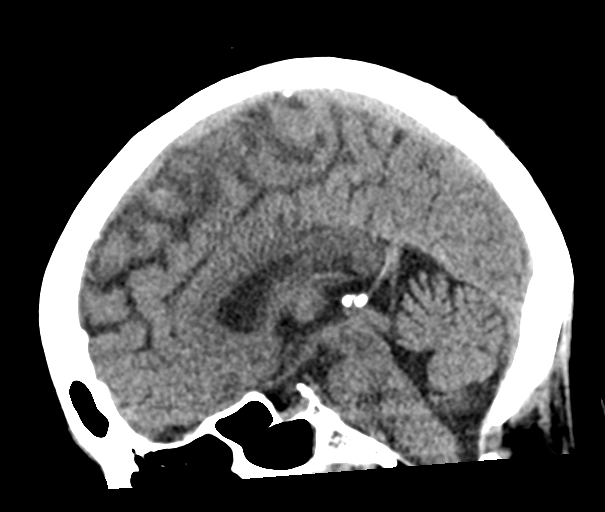
[im 37/55  brain]
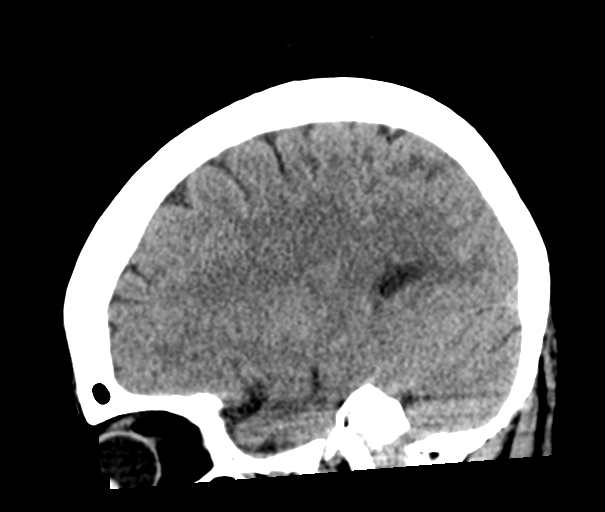

[16 of 44 positions shown; findings below may reference images not displayed]

FINDINGS: Brain: No evidence of acute infarction, hemorrhage, cerebral edema,
mass, mass effect, or midline shift. No hydrocephalus or extra-axial
fluid collection.

Vascular: No hyperdense vessel.

Skull: Normal. Negative for fracture or focal lesion.

Sinuses/Orbits: No acute finding.

Other: The mastoid air cells are well aerated.
IMPRESSION: IMPRESSION
No acute intracranial process.

## 2023-12-21 ENCOUNTER — Emergency Department (HOSPITAL_COMMUNITY): Payer: 59

## 2023-12-21 ENCOUNTER — Encounter (HOSPITAL_COMMUNITY): Payer: Self-pay | Admitting: *Deleted

## 2023-12-21 ENCOUNTER — Other Ambulatory Visit: Payer: Self-pay

## 2023-12-21 ENCOUNTER — Emergency Department (HOSPITAL_COMMUNITY)
Admission: EM | Admit: 2023-12-21 | Discharge: 2023-12-22 | Discharge status: Against Medical Advice | Payer: 59 | Attending: Emergency Medicine | Admitting: Emergency Medicine

## 2023-12-21 DIAGNOSIS — Z5321 Procedure and treatment not carried out due to patient leaving prior to being seen by health care provider: Secondary | ICD-10-CM | POA: Diagnosis not present

## 2023-12-21 DIAGNOSIS — R0789 Other chest pain: Secondary | ICD-10-CM | POA: Insufficient documentation

## 2023-12-21 HISTORY — DX: Gastro-esophageal reflux disease without esophagitis: K21.9

## 2023-12-21 LAB — CBC
HCT: 38.7 % (ref 36.0–46.0)
Hemoglobin: 12.5 g/dL (ref 12.0–15.0)
MCH: 30.4 pg (ref 26.0–34.0)
MCHC: 32.3 g/dL (ref 30.0–36.0)
MCV: 94.2 fL (ref 80.0–100.0)
Platelets: 257 10*3/uL (ref 150–400)
RBC: 4.11 MIL/uL (ref 3.87–5.11)
RDW: 13.2 % (ref 11.5–15.5)
WBC: 9.9 10*3/uL (ref 4.0–10.5)
nRBC: 0 % (ref 0.0–0.2)

## 2023-12-21 LAB — TROPONIN I (HIGH SENSITIVITY): Troponin I (High Sensitivity): 4 ng/L (ref <18)

## 2023-12-21 LAB — COMPREHENSIVE METABOLIC PANEL
ALT: 22 U/L (ref 0–44)
AST: 24 U/L (ref 15–41)
Albumin: 3.7 g/dL (ref 3.5–5.0)
Alkaline Phosphatase: 64 U/L (ref 38–126)
Anion gap: 10 (ref 5–15)
BUN: 23 mg/dL (ref 8–23)
CO2: 23 mmol/L (ref 22–32)
Calcium: 9.1 mg/dL (ref 8.9–10.3)
Chloride: 108 mmol/L (ref 98–111)
Creatinine, Ser: 1.08 mg/dL — ABNORMAL HIGH (ref 0.44–1.00)
GFR, Estimated: 58 mL/min — ABNORMAL LOW (ref >60)
Glucose, Bld: 115 mg/dL — ABNORMAL HIGH (ref 70–99)
Potassium: 3.8 mmol/L (ref 3.5–5.1)
Sodium: 141 mmol/L (ref 135–145)
Total Bilirubin: 0.8 mg/dL (ref 0.0–1.2)
Total Protein: 7.4 g/dL (ref 6.5–8.1)

## 2023-12-21 LAB — LIPASE, BLOOD: Lipase: 32 U/L (ref 11–51)

## 2023-12-21 MED ORDER — ASPIRIN 81 MG PO CHEW
324.0000 mg | CHEWABLE_TABLET | Freq: Once | ORAL | Status: AC
  Administered 2023-12-21: 324 mg via ORAL
  Filled 2023-12-21: qty 4

## 2023-12-21 NOTE — ED Triage Notes (Signed)
 Pt states acute onset R chest pain that radiates to R scapula, along with difficulty taking a deep breath.  Pain began approx 9 pm.  VS and EKG wnl.

## 2023-12-21 NOTE — ED Provider Triage Note (Signed)
 Emergency Medicine Provider Triage Evaluation Note  Sheryl Brown , a 63 y.o. female  was evaluated in triage.  Pt complains of right-sided chest pain that radiates to the right scapula that started approximately 1 hour prior to arrival.  Reports pain is better when she holds her right breast up.  Denies any dizziness, jaw pain, arm pain, nausea or vomiting.  Review of Systems  Positive: As above Negative: As above  Physical Exam  BP (!) 154/76 (BP Location: Right Arm)   Pulse 88   Temp 98.1 F (36.7 C) (Oral)   Resp 14   Ht 5' (1.524 m)   Wt 79.6 kg   SpO2 100%   BMI 34.27 kg/m  Gen:   Awake, no distress   Resp:  Normal effort  MSK:   Moves extremities without difficulty    Medical Decision Making  Medically screening exam initiated at 10:50 PM.  Appropriate orders placed.  TANESIA BUTNER was informed that the remainder of the evaluation will be completed by another provider, this initial triage assessment does not replace that evaluation, and the importance of remaining in the ED until their evaluation is complete.     Veta Palma, PA-C 12/21/23 2252

## 2023-12-22 ENCOUNTER — Encounter (HOSPITAL_COMMUNITY): Payer: Self-pay

## 2023-12-22 ENCOUNTER — Encounter: Payer: 59 | Admitting: Rehabilitative and Restorative Service Providers"

## 2023-12-22 ENCOUNTER — Ambulatory Visit (HOSPITAL_COMMUNITY)
Admission: EM | Admit: 2023-12-22 | Discharge: 2023-12-22 | Disposition: A | Discharge status: Home / Self Care | Payer: 59 | Attending: Emergency Medicine | Admitting: Emergency Medicine

## 2023-12-22 DIAGNOSIS — R0602 Shortness of breath: Secondary | ICD-10-CM

## 2023-12-22 DIAGNOSIS — R0789 Other chest pain: Secondary | ICD-10-CM | POA: Diagnosis not present

## 2023-12-22 LAB — TROPONIN I (HIGH SENSITIVITY): Troponin I (High Sensitivity): 4 ng/L (ref <18)

## 2023-12-22 MED ORDER — AZITHROMYCIN 250 MG PO TABS: ORAL_TABLET | ORAL | 0 refills | Status: DC

## 2023-12-22 MED ORDER — AMOXICILLIN 500 MG PO CAPS: 500.0000 mg | ORAL_CAPSULE | Freq: Three times a day (TID) | ORAL | 0 refills | Status: DC

## 2023-12-22 MED ORDER — BACLOFEN 10 MG PO TABS: 10.0000 mg | ORAL_TABLET | Freq: Three times a day (TID) | ORAL | 0 refills | Status: DC

## 2023-12-22 NOTE — ED Provider Notes (Addendum)
 [facility]    CSN: [id number] Arrival date & time: 12/22/23  4098      History   Chief Complaint Chief Complaint  Patient presents with   Shortness of Breath   Chest Pain    HPI Sheryl Brown is a 63 y.o. female.   Patient presented with sudden onset of right sided chest pain with breathing and intermittent shortness of breath that began around 8pm last night. Described pain as squeezing/pressure. Patient states that she now has the same pain in her right arm. Patient was seen in the ED last night for same, but left prior to receiving results due to wait time.    Shortness of Breath Associated symptoms: chest pain   Associated symptoms: no cough and no wheezing   Chest Pain Associated symptoms: shortness of breath   Associated symptoms: no cough, no dizziness and no weakness     Past Medical History:  Diagnosis Date   GERD (gastroesophageal reflux disease)    Meningitis 2013   Neuropathy    Seasonal allergies    Seizures (HCC)    Stroke [facility])     Patient Active Problem List   Diagnosis Date Noted   Vitamin D deficiency 09/01/2019   Seizure (HCC) 01/18/2019   TIA (transient ischemic attack) 01/18/2019   HTN (hypertension) 01/28/2018   Hyperlipidemia 06/03/2017   Chronic bilateral low back pain without sciatica 12/12/2016   URI 01/20/2009   TOBACCO ABUSE 12/08/2008   CANDIDIASIS, VAGINAL 10/21/2008   DEPRESSION, SITUATIONAL 05/31/2008   DENTAL PAIN 02/23/2008   ACUTE GINGIVITIS NONPLAQUE INDUCED 12/24/2007   INGUINAL PAIN, RIGHT 12/24/2007   UTI 11/26/2007   ROTATOR CUFF SYNDROME, RIGHT 07/24/2007   GERD 07/23/2007   ELEVATED BLOOD PRESSURE WITHOUT DIAGNOSIS OF HYPERTENSION 05/28/2007   SYMPTOM, PAIN, ABDOMINAL, GENERALIZED 11/25/2006   LABYRINTHITIS, HX OF 09/11/2006    Past Surgical History:  Procedure Laterality Date   CESAREAN SECTION      OB History   No obstetric history on file.      Home Medications    Prior to Admission  medications   Medication Sig Start Date End Date Taking? Authorizing Provider  amoxicillin  (AMOXIL ) 500 MG capsule Take 1 capsule (500 mg total) by mouth 3 (three) times daily. 12/22/23  Yes Rosevelt Constable, Annarae Macnair A, NP  azithromycin  (ZITHROMAX  Z-PAK) 250 MG tablet Take 2 pills (500mg ) first day and one pill (250mg ) the remaining 4 days. 12/22/23  Yes Rosevelt Constable, Keasia Dubose A, NP  baclofen  (LIORESAL ) 10 MG tablet Take 1 tablet (10 mg total) by mouth 3 (three) times daily. 12/22/23  Yes Rosevelt Constable, Quame Spratlin A, NP  amitriptyline  (ELAVIL ) 50 MG tablet Take 50 mg by mouth at bedtime. 01/11/22   [provider]  amLODipine  (NORVASC ) 5 MG tablet TAKE 1 TABLET(5 MG) BY MOUTH DAILY Patient taking differently: Take 5 mg by mouth daily. 09/12/21   Marius Siemens, NP  atorvastatin  (LIPITOR) 20 MG tablet Take 1 tablet (20 mg total) by mouth daily. 07/22/21   Marius Siemens, NP  fluconazole  (DIFLUCAN ) 150 MG tablet Take 1 tablet (150 mg total) by mouth daily. Patient not taking: Reported on 11/25/2023 08/28/23   Harlow Lighter, [place]  N, FNP  gabapentin  (NEURONTIN ) 100 MG capsule TAKE 1 CAPSULE(100 MG) BY MOUTH AT BEDTIME 12/08/23   Cassandra Cleveland, MD  lamoTRIgine  (LAMICTAL ) 150 MG tablet Take 1 tablet (150 mg total) by mouth daily. 10/07/23 07/03/24  Camara, Amadou, MD  naproxen  (NAPROSYN ) 375 MG tablet Take 1 tablet (375 mg total)  by mouth 2 (two) times daily. 12/25/21   Kommor, Madison, MD  ondansetron  (ZOFRAN -ODT) 4 MG disintegrating tablet Take 1 tablet (4 mg total) by mouth every 8 (eight) hours as needed for nausea or vomiting. 05/29/23   Mordecai Applebaum, MD  pantoprazole  (PROTONIX ) 40 MG tablet Take 1 tablet (40 mg total) by mouth 2 (two) times daily. 06/02/23   Lucina Sabal, PA-C  PARoxetine (PAXIL) 20 MG tablet Take 20 mg by mouth daily. 10/03/21   [provider]  polyethylene glycol powder (MIRALAX ) 17 GM/SCOOP powder Take 17 g by mouth daily. Patient not taking: Reported on 11/25/2023 06/02/23   Lucina Sabal, PA-C  sucralfate  (CARAFATE ) 1 g tablet Take 1 tablet (1 g total) by mouth 4 (four) times daily. Patient not taking: Reported on 11/25/2023 05/27/23   Lind Repine, MD  SUMAtriptan  (IMITREX ) 50 MG tablet Take 1 tablet (50 mg total) by mouth every 2 (two) hours as needed. May repeat in 2 hours if headache persists or recurs. 10/29/23   Camara, Amadou, MD  valsartan  (DIOVAN ) 160 MG tablet TAKE 1 TABLET(160 MG) BY MOUTH DAILY Patient taking differently: Take 160 mg by mouth 2 (two) times daily. 09/12/21   Marius Siemens, NP    Family History Family History  Problem Relation Age of Onset   Heart failure Mother    Cirrhosis Father     Social History Social History   Tobacco Use   Smoking status: Every Day    Current packs/day: 0.25    Types: Cigarettes   Smokeless tobacco: Never  Vaping Use   Vaping status: Never Used  Substance Use Topics   Alcohol use: Yes    Comment: only on holidays   Drug use: Never     Allergies   Gabapentin    Review of Systems Review of Systems  Respiratory:  Positive for shortness of breath. Negative for cough, chest tightness and wheezing.   Cardiovascular:  Positive for chest pain.  Musculoskeletal:  Positive for myalgias.  Neurological:  Negative for dizziness, weakness and light-headedness.     Physical Exam Triage Vital Signs ED Triage Vitals  Encounter Vitals Group     BP 12/22/23 0858 (!) 145/88     Systolic BP Percentile --      Diastolic BP Percentile --      Pulse Rate 12/22/23 0858 80     Resp 12/22/23 0858 18     Temp 12/22/23 0858 98.3 F (36.8 C)     Temp Source 12/22/23 0858 Oral     SpO2 12/22/23 0858 97 %     Weight --      Height --      Head Circumference --      Peak Flow --      Pain Score 12/22/23 0854 9     Pain Loc --      Pain Education --      Exclude from Growth Chart --    No data found.  Updated Vital Signs BP (!) 145/88 (BP Location: Left Arm)   Pulse 80   Temp 98.3 F (36.8 C) (Oral)    Resp 18   SpO2 97%   Visual Acuity Right Eye Distance:   Left Eye Distance:   Bilateral Distance:    Right Eye Near:   Left Eye Near:    Bilateral Near:     Physical Exam Vitals and nursing note reviewed.  Constitutional:      General: She is awake. She is not  in acute distress.    Appearance: Normal appearance. She is well-developed and well-groomed. She is not ill-appearing.  Cardiovascular:     Rate and Rhythm: Normal rate and regular rhythm.  Pulmonary:     Effort: Pulmonary effort is normal.     Breath sounds: Normal breath sounds.  Chest:     Chest wall: Tenderness present. No swelling or edema.  Musculoskeletal:     Right shoulder: Tenderness present. No swelling or bony tenderness. Normal range of motion.     Left shoulder: Normal.     Cervical back: Normal range of motion and neck supple.  Skin:    General: Skin is warm and dry.  Neurological:     Mental Status: She is alert.  Psychiatric:        Behavior: Behavior is cooperative.      UC Treatments / Results  Labs (all labs ordered are listed, but only abnormal results are displayed) Labs Reviewed - No data to display  EKG   Radiology DG Chest 2 View Result Date: 12/21/2023 CLINICAL DATA:  Right chest pain. EXAM: CHEST - 2 VIEW COMPARISON:  Portable chest 05/27/2023. FINDINGS: The heart is slightly enlarged. No vascular congestion is seen. The mediastinum is normally outlined. There is a low inspiration on exam. There is increased opacity in the left CP angle area on the AP, which could be asymmetric atelectasis or a small pneumonia. Remaining lungs are clear and the sulci are sharp. Osteopenia and thoracic spondylosis. IMPRESSION: 1. Increased opacity in the left CP angle area on the AP, which could be asymmetric atelectasis or a small pneumonia. Follow-up study recommended in full inspiration. 2. Low inspiration study. 3. Mild cardiomegaly. Electronically Signed   By: Denman Fischer M.D.   On: 12/21/2023  22:28    Procedures Procedures (including critical care time)  Medications Ordered in UC Medications - No data to display  Initial Impression / Assessment and Plan / UC Course  I have reviewed the triage vital signs and the nursing notes.  Pertinent labs & imaging results that were available during my care of the patient were reviewed by me and considered in my medical decision making (see chart for details).     Patient presented with right sided chest pain with breathing and intermittent shortness of breath that began last night. Patient was seen in ED for same, but left prior to receiving results due to wait time.   Upon assessment tenderness upon palpation to right chest wall and shoulder. Patient endorses worsening discomfort upon movement of right arm. Work up from ED was insignificant other than X-ray from yesterday did reveal a possible small pneumonia.  Unlikely cardiac in nature due to reproducible pain upon palpation and movement. Prescribed Baclofen  as needed for muscle pain and spasms. Prescribed amoxicillin  and azithromycin  for pneumonia coverage. Discussed following up with primary care if symptoms persist for repeat or further imaging. Discussed return and strict ER precautions.  Final Clinical Impressions(s) / UC Diagnoses   Final diagnoses:  Chest wall pain  Shortness of breath     Discharge Instructions      Start taking azithromycin  by taking 2 tablets today and 1 tablet on the remaining 4 days. Also, start taking amoxicillin  3 times daily for seven days. You can take Baclofen  as needed for muscle pain and spasms. This can make you drowsy so do not work or drive while taking. Otherwise you can take Tylenol  every 4-6 hours as needed for pain. If you develop worsening  chest pain, shortness of breath, dizziness, passing out, numbness, or weakness please seek immediate medical treatment in the ER.     ED Prescriptions     Medication Sig Dispense Auth. Provider    baclofen  (LIORESAL ) 10 MG tablet Take 1 tablet (10 mg total) by mouth 3 (three) times daily. 30 each Levora Reas A, NP   azithromycin  (ZITHROMAX  Z-PAK) 250 MG tablet Take 2 pills (500mg ) first day and one pill (250mg ) the remaining 4 days. 6 tablet Levora Reas A, NP   amoxicillin  (AMOXIL ) 500 MG capsule Take 1 capsule (500 mg total) by mouth 3 (three) times daily. 21 capsule Levora Reas A, NP      PDMP not reviewed this encounter.   Karon Packer, NP 12/22/23 1014    Levora Reas A, NP 12/22/23 1017

## 2023-12-22 NOTE — ED Triage Notes (Addendum)
 Pt went to the ED last night via ambulance and was discharged. Pt states she wants the results from her visit last night.   Last night at 2030 she started having SOB and right sided chest pain upon breathing. States she feels a pressure/squeezing in her right arm/chest. Today the pain has also moved to her right arm/shoulder.   Start Date: 12/21/2023   Home Interventions: None

## 2023-12-22 NOTE — Discharge Instructions (Addendum)
 Start taking azithromycin  by taking 2 tablets today and 1 tablet on the remaining 4 days. Also, start taking amoxicillin  3 times daily for seven days. You can take Baclofen  as needed for muscle pain and spasms. This can make you drowsy so do not work or drive while taking. Otherwise you can take Tylenol  every 4-6 hours as needed for pain. If you develop worsening chest pain, shortness of breath, dizziness, passing out, numbness, or weakness please seek immediate medical treatment in the ER.

## 2023-12-22 NOTE — ED Notes (Signed)
 While obtaining v/s and proceeded to get pt's tempature, the thermometer probe popped off in the pt's mouth.  Pt states that her gum is now numb and hurts.  I will notify triage nurse.  KM

## 2023-12-22 NOTE — ED Notes (Signed)
 Pt called for repeat vitals x3 no answer

## 2023-12-22 NOTE — ED Notes (Signed)
 Pt stated she was leaving and was seen leaving the ED

## 2023-12-24 ENCOUNTER — Ambulatory Visit: Payer: 59 | Admitting: Physical Therapy

## 2023-12-24 ENCOUNTER — Emergency Department (HOSPITAL_COMMUNITY): Payer: 59

## 2023-12-24 ENCOUNTER — Encounter (HOSPITAL_COMMUNITY): Payer: Self-pay | Admitting: Radiology

## 2023-12-24 ENCOUNTER — Emergency Department (HOSPITAL_COMMUNITY)
Admission: EM | Admit: 2023-12-24 | Discharge: 2023-12-24 | Disposition: A | Discharge status: Home / Self Care | Payer: 59 | Attending: Emergency Medicine | Admitting: Emergency Medicine

## 2023-12-24 ENCOUNTER — Other Ambulatory Visit: Payer: Self-pay

## 2023-12-24 DIAGNOSIS — R0602 Shortness of breath: Secondary | ICD-10-CM

## 2023-12-24 DIAGNOSIS — Z79899 Other long term (current) drug therapy: Secondary | ICD-10-CM | POA: Insufficient documentation

## 2023-12-24 DIAGNOSIS — I10 Essential (primary) hypertension: Secondary | ICD-10-CM | POA: Diagnosis not present

## 2023-12-24 DIAGNOSIS — R911 Solitary pulmonary nodule: Secondary | ICD-10-CM | POA: Diagnosis not present

## 2023-12-24 DIAGNOSIS — I1A0 Resistant hypertension: Secondary | ICD-10-CM | POA: Insufficient documentation

## 2023-12-24 LAB — COMPREHENSIVE METABOLIC PANEL
ALT: 18 U/L (ref 0–44)
AST: 24 U/L (ref 15–41)
Albumin: 3.8 g/dL (ref 3.5–5.0)
Alkaline Phosphatase: 58 U/L (ref 38–126)
Anion gap: 8 (ref 5–15)
BUN: 17 mg/dL (ref 8–23)
CO2: 23 mmol/L (ref 22–32)
Calcium: 9.1 mg/dL (ref 8.9–10.3)
Chloride: 108 mmol/L (ref 98–111)
Creatinine, Ser: 0.7 mg/dL (ref 0.44–1.00)
GFR, Estimated: >60 mL/min (ref >60)
Glucose, Bld: 100 mg/dL — ABNORMAL HIGH (ref 70–99)
Potassium: 4.3 mmol/L (ref 3.5–5.1)
Sodium: 139 mmol/L (ref 135–145)
Total Bilirubin: 0.9 mg/dL (ref 0.0–1.2)
Total Protein: 7.6 g/dL (ref 6.5–8.1)

## 2023-12-24 LAB — CBC WITH DIFFERENTIAL/PLATELET
Abs Immature Granulocytes: 0.03 10*3/uL (ref 0.00–0.07)
Basophils Absolute: 0 10*3/uL (ref 0.0–0.1)
Basophils Relative: 0 %
Eosinophils Absolute: 0.1 10*3/uL (ref 0.0–0.5)
Eosinophils Relative: 1 %
HCT: 38.4 % (ref 36.0–46.0)
Hemoglobin: 12.3 g/dL (ref 12.0–15.0)
Immature Granulocytes: 0 %
Lymphocytes Relative: 34 %
Lymphs Abs: 3 10*3/uL (ref 0.7–4.0)
MCH: 30.2 pg (ref 26.0–34.0)
MCHC: 32 g/dL (ref 30.0–36.0)
MCV: 94.3 fL (ref 80.0–100.0)
Monocytes Absolute: 0.8 10*3/uL (ref 0.1–1.0)
Monocytes Relative: 9 %
Neutro Abs: 4.8 10*3/uL (ref 1.7–7.7)
Neutrophils Relative %: 56 %
Platelets: 243 10*3/uL (ref 150–400)
RBC: 4.07 MIL/uL (ref 3.87–5.11)
RDW: 13 % (ref 11.5–15.5)
WBC: 8.8 10*3/uL (ref 4.0–10.5)
nRBC: 0 % (ref 0.0–0.2)

## 2023-12-24 LAB — BRAIN NATRIURETIC PEPTIDE: B Natriuretic Peptide: 51.6 pg/mL (ref 0.0–100.0)

## 2023-12-24 LAB — RESP PANEL BY RT-PCR (RSV, FLU A&B, COVID)  RVPGX2
Influenza A by PCR: NEGATIVE
Influenza B by PCR: NEGATIVE
Resp Syncytial Virus by PCR: NEGATIVE
SARS Coronavirus 2 by RT PCR: NEGATIVE

## 2023-12-24 LAB — I-STAT CG4 LACTIC ACID, ED: Lactic Acid, Venous: 0.7 mmol/L (ref 0.5–1.9)

## 2023-12-24 MED ORDER — HYDRALAZINE HCL 20 MG/ML IJ SOLN: 10.0000 mg | Freq: Once | INTRAMUSCULAR | Status: DC

## 2023-12-24 MED ORDER — AMLODIPINE BESYLATE 10 MG PO TABS: 10.0000 mg | ORAL_TABLET | Freq: Every day | ORAL | 0 refills | Status: DC

## 2023-12-24 MED ORDER — IOHEXOL 350 MG/ML SOLN
75.0000 mL | Freq: Once | INTRAVENOUS | Status: AC | PRN
  Administered 2023-12-24: 75 mL via INTRAVENOUS

## 2023-12-24 MED ORDER — AMLODIPINE BESYLATE 5 MG PO TABS
10.0000 mg | ORAL_TABLET | Freq: Once | ORAL | Status: AC
  Administered 2023-12-24: 10 mg via ORAL
  Filled 2023-12-24: qty 2

## 2023-12-24 MED ORDER — SODIUM CHLORIDE (PF) 0.9 % IJ SOLN
INTRAMUSCULAR | Status: AC
  Filled 2023-12-24: qty 50

## 2023-12-24 NOTE — Care Management (Addendum)
Transition of Care [facility]) - Emergency Department Mini Assessment   Patient Details  Name: Sheryl Brown MRN: [id number] Date of Birth: January 11, 1961  Transition of Care [facility]) CM/SW Contact:    Lavenia Atlas, RN Phone Number: 12/24/2023, 6:45 PM   Clinical Narrative: Received TOC consult for HH/DME. Per chart review patient was sent from her MD for chest pain and shortness of breath. This RNCM spoke with patient who reports prior to admission she uses a cane however will feel more stable with a RW. Patient reports she lives alone however her sister checks on her regularly. This RNCM offered HH choice, patient chose any HH agency that will accept her insurance. Patient also inquired about [facility], this RNCM advised that is an out of pocket expense.  This RNCM spoke with Amy with [facility] who follow patient in the community for HHPT/OT, HHA, SW. Notified MD need for DME: [place] and HH services have been set up with [facility]. Notified patient.  No additional TOC needs at this time.    ED Mini Assessment: What brought you to the Emergency Department? : Patient reports sent by her PCP with c/o chest pain and shortness of breath  Barriers to Discharge: No Home Care Agency will accept this patient  Barrier interventions: coordinating [id number] and [facility] services.  Means of departure: Car  Interventions which prevented an admission or readmission: Home Health Consult or Services, DME Provided    Patient Contact and Communications        ,          Patient states their goals for this hospitalization and ongoing recovery are:: To return home feeling better CMS Medicare.gov Compare Post Acute Care list provided to:: Patient Choice offered to / list presented to : Patient  Admission diagnosis:  Abn Labs Chest Pain Patient Active Problem List   Diagnosis Date Noted   Vitamin D deficiency 09/01/2019   Seizure (HCC) 01/18/2019   TIA (transient ischemic attack) 01/18/2019   HTN  (hypertension) 01/28/2018   Hyperlipidemia 06/03/2017   Chronic bilateral low back pain without sciatica 12/12/2016   URI 01/20/2009   TOBACCO ABUSE 12/08/2008   CANDIDIASIS, VAGINAL 10/21/2008   DEPRESSION, SITUATIONAL 05/31/2008   DENTAL PAIN 02/23/2008   ACUTE GINGIVITIS NONPLAQUE INDUCED 12/24/2007   INGUINAL PAIN, RIGHT 12/24/2007   UTI 11/26/2007   ROTATOR CUFF SYNDROME, RIGHT 07/24/2007   GERD 07/23/2007   ELEVATED BLOOD PRESSURE WITHOUT DIAGNOSIS OF HYPERTENSION 05/28/2007   SYMPTOM, PAIN, ABDOMINAL, GENERALIZED 11/25/2006   LABYRINTHITIS, HX OF 09/11/2006   PCP:  Stevphen Rochester, MD Pharmacy:   [facility] [phone] Ginette Otto, [place] - [address] AT [address] [place]  [address] Phone: [phone] Fax: [phone]  [facility] [address] [place] [address] Phone: [phone] Fax: [phone]

## 2023-12-24 NOTE — ED Provider Notes (Signed)
  Physical Exam  BP (!) 145/85   Pulse 70   Temp 98.3 F (36.8 C)   Resp 18   SpO2 98%   Physical Exam  Procedures  Procedures  ED Course / MDM    Medical Decision Making Care assumed at 4 PM.  Patient is here with hypertension and shortness of breath.  Recently diagnosed with pneumonia and has been on antibiotics.  Patient's blood pressure was 200/100 in the office earlier today.  Patient is on Diovan and Norvasc.  Signout pending chest x-ray and possible CT scan of the chest if chest x-ray is nondiagnostic  6:55 PM I reviewed patient's labs and lactate is normal and chest x-ray is clear.  BNP is normal.  COVID and flu and RSV negative.  CT chest was ordered and showed no PE or pneumonia.  Patient does have a 4 mm nodule that was present previously.  Blood pressure is down to 145/85.  Her blood pressure recordings in the ER has been in the 140s to 160s.  She is on Norvasc 5 mg only so we will increase it to 10 mg daily.  Will have her continue her Diovan  Problems Addressed: Resistant hypertension: acute illness or injury Shortness of breath: acute illness or injury  Amount and/or Complexity of Data Reviewed Labs: ordered. Decision-making details documented in ED Course. Radiology: ordered and independent interpretation performed. Decision-making details documented in ED Course.  Risk Prescription drug management.          Charlynne Pander, MD 12/24/23 [phone]

## 2023-12-24 NOTE — ED Triage Notes (Signed)
Pt arrived reporting she was sent by her doc. States being treated for pne currently. Went to MD office and lab work resulted and was advised to come to ED. Endorses abnormal kidney function. States feels chest pain with shob that has been constant. Denies any other symptoms.

## 2023-12-24 NOTE — ED Provider Notes (Signed)
EMERGENCY DEPARTMENT AT [facility] Provider Note   CSN: [id number] Arrival date & time: 12/24/23  1414     History  Chief Complaint  Patient presents with   sent by the doc   Chest Pain   Shortness of Breath    Sheryl Brown is a 63 y.o. female.   Chest Pain Associated symptoms: shortness of breath   Shortness of Breath Associated symptoms: chest pain     . Patient presents with shortness of breath.  Cough.  Has been feeling worse.  Seen in the urgent care 2 days ago and diagnosed with pneumonia.  Follow-up today.  Reportedly more short of breath.  Still having cough.  May have some fevers and chills.  States has been having more swelling of the leg.  Sent in reportedly due to hypertension.  Blood pressure reportedly 200/115.  Does have a history of hypertension.  States potentially more swelling on the leg.  States more difficulty breathing and think should have trouble walking out of the car.  Has been taking the antibiotic.    Past Medical History:  Diagnosis Date   GERD (gastroesophageal reflux disease)    Meningitis 2013   Neuropathy    Seasonal allergies    Seizures (HCC)    Stroke (HCC)     Home Medications Prior to Admission medications   Medication Sig Start Date End Date Taking? Authorizing Provider  amitriptyline (ELAVIL) 50 MG tablet Take 50 mg by mouth at bedtime. 01/11/22   [provider]  amLODipine (NORVASC) 5 MG tablet TAKE 1 TABLET(5 MG) BY MOUTH DAILY Patient taking differently: Take 5 mg by mouth daily. 09/12/21   Grayce Sessions, NP  amoxicillin (AMOXIL) 500 MG capsule Take 1 capsule (500 mg total) by mouth 3 (three) times daily. 12/22/23   Wynonia Lawman A, NP  atorvastatin (LIPITOR) 20 MG tablet Take 1 tablet (20 mg total) by mouth daily. 07/22/21   Grayce Sessions, NP  azithromycin (ZITHROMAX Z-PAK) 250 MG tablet Take 2 pills (500mg ) first day and one pill (250mg ) the remaining 4 days. 12/22/23   Wynonia Lawman A, NP  baclofen (LIORESAL) 10 MG tablet Take 1 tablet (10 mg total) by mouth 3 (three) times daily. 12/22/23   Wynonia Lawman A, NP  fluconazole (DIFLUCAN) 150 MG tablet Take 1 tablet (150 mg total) by mouth daily. Patient not taking: Reported on 11/25/2023 08/28/23   Garrison, [place] N, FNP  gabapentin (NEURONTIN) 100 MG capsule TAKE 1 CAPSULE(100 MG) BY MOUTH AT BEDTIME 12/08/23   Windell Norfolk, MD  lamoTRIgine (LAMICTAL) 150 MG tablet Take 1 tablet (150 mg total) by mouth daily. 10/07/23 07/03/24  Windell Norfolk, MD  naproxen (NAPROSYN) 375 MG tablet Take 1 tablet (375 mg total) by mouth 2 (two) times daily. 12/25/21   Kommor, Madison, MD  ondansetron (ZOFRAN-ODT) 4 MG disintegrating tablet Take 1 tablet (4 mg total) by mouth every 8 (eight) hours as needed for nausea or vomiting. 05/29/23   Lonell Grandchild, MD  pantoprazole (PROTONIX) 40 MG tablet Take 1 tablet (40 mg total) by mouth 2 (two) times daily. 06/02/23   Marita Kansas, PA-C  PARoxetine (PAXIL) 20 MG tablet Take 20 mg by mouth daily. 10/03/21   [provider]  polyethylene glycol powder (MIRALAX) 17 GM/SCOOP powder Take 17 g by mouth daily. Patient not taking: Reported on 11/25/2023 06/02/23   Marita Kansas, PA-C  sucralfate (CARAFATE) 1 g tablet Take 1 tablet (1 g total) by  mouth 4 (four) times daily. Patient not taking: Reported on 11/25/2023 05/27/23   Lorre Nick, MD  SUMAtriptan (IMITREX) 50 MG tablet Take 1 tablet (50 mg total) by mouth every 2 (two) hours as needed. May repeat in 2 hours if headache persists or recurs. 10/29/23   Windell Norfolk, MD  valsartan (DIOVAN) 160 MG tablet TAKE 1 TABLET(160 MG) BY MOUTH DAILY Patient taking differently: Take 160 mg by mouth 2 (two) times daily. 09/12/21   Grayce Sessions, NP      Allergies    Gabapentin    Review of Systems   Review of Systems  Respiratory:  Positive for shortness of breath.   Cardiovascular:  Positive for chest pain.    Physical Exam Updated  Vital Signs BP (!) 169/97 (BP Location: Left Arm)   Pulse 84   Temp 98.5 F (36.9 C)   Resp 16   SpO2 99%  Physical Exam Vitals and nursing note reviewed.  Cardiovascular:     Rate and Rhythm: Normal rate.  Pulmonary:     Breath sounds: No decreased breath sounds or wheezing.  Chest:     Chest wall: No tenderness.  Abdominal:     Tenderness: There is no abdominal tenderness.  Musculoskeletal:     Comments: Mild edema bilateral lower extremities.  Skin:    General: Skin is warm.  Neurological:     Mental Status: She is alert.     ED Results / Procedures / Treatments   Labs (all labs ordered are listed, but only abnormal results are displayed) Labs Reviewed  COMPREHENSIVE METABOLIC PANEL - Abnormal; Notable for the following components:      Result Value   Glucose, Bld 100 (*)    All other components within normal limits  CULTURE, BLOOD (ROUTINE X 2)  CULTURE, BLOOD (ROUTINE X 2)  RESP PANEL BY RT-PCR (RSV, FLU A&B, COVID)  RVPGX2  CBC WITH DIFFERENTIAL/PLATELET  BRAIN NATRIURETIC PEPTIDE  I-STAT CG4 LACTIC ACID, ED  I-STAT CG4 LACTIC ACID, ED    EKG None  Radiology No results found.  Procedures Procedures    Medications Ordered in ED Medications  iohexol (OMNIPAQUE) 350 MG/ML injection 75 mL (has no administration in time range)    ED Course/ Medical Decision Making/ A&P                                 Medical Decision Making Amount and/or Complexity of Data Reviewed Labs: ordered. Radiology: ordered.  Risk Prescription drug management.   Patient shortness of breath cough recently diagnosed with pneumonia.  Has been on antibiotics.  Creatinine had been mildly creased 2 days ago.  Reportedly sent in for hypertension and feeling worse.  Reviewed paperwork that came with patient looks like they were scheduling outpatient echocardiogram and some blood work including blood cultures.  Had flu and strep testing done today.  With more fatigue and  feeling more short of breath will get x-ray.  Will repeat blood work including kidney function.  Will get BNP with about swelling in the legs.  Reviewed paperwork that came with patient.  Differential diagnosis does include worsening pneumonia since patient has been more short of breath despite taking antibiotics.  Care turned over to Dr Silverio Lay.        Final Clinical Impression(s) / ED Diagnoses Final diagnoses:  None    Rx / DC Orders ED Discharge Orders     None  Benjiman Core, MD 12/24/23 [phone]

## 2023-12-24 NOTE — Discharge Instructions (Signed)
You have elevated blood pressure.  I have increased her Norvasc to 10 mg daily.  Please continue your Diovan as prescribed  As we discussed your CT scan did not show any blood clots in your lung or pneumonia.  You may stop taking the antibiotics.  Please follow-up with your doctor in a week to recheck your blood pressure  Case manager will try and deliver your Reger tomorrow and we will try and set up home health  Return to ER if you have worse shortness of breath or blood pressure greater than 200

## 2023-12-29 LAB — CULTURE, BLOOD (ROUTINE X 2)
Culture: NO GROWTH
Culture: NO GROWTH

## 2023-12-31 ENCOUNTER — Ambulatory Visit: Payer: 59 | Admitting: Rehabilitative and Restorative Service Providers"

## 2023-12-31 ENCOUNTER — Ambulatory Visit: Payer: 59 | Admitting: Physical Therapy

## 2024-01-02 ENCOUNTER — Encounter: Payer: 59 | Admitting: Rehabilitative and Restorative Service Providers"

## 2024-01-07 ENCOUNTER — Ambulatory Visit: Payer: 59 | Admitting: Physical Therapy

## 2024-01-09 ENCOUNTER — Encounter: Payer: 59 | Admitting: Rehabilitative and Restorative Service Providers"

## 2024-01-14 ENCOUNTER — Ambulatory Visit: Payer: 59 | Admitting: Physical Therapy

## 2024-01-16 ENCOUNTER — Encounter: Payer: 59 | Admitting: Rehabilitative and Restorative Service Providers"

## 2024-03-27 ENCOUNTER — Emergency Department (HOSPITAL_BASED_OUTPATIENT_CLINIC_OR_DEPARTMENT_OTHER)

## 2024-03-27 ENCOUNTER — Other Ambulatory Visit: Payer: Self-pay

## 2024-03-27 ENCOUNTER — Observation Stay (HOSPITAL_BASED_OUTPATIENT_CLINIC_OR_DEPARTMENT_OTHER)
Admission: EM | Admit: 2024-03-27 | Discharge: 2024-03-31 | Disposition: A | Discharge status: Home / Self Care | Attending: Internal Medicine | Admitting: Internal Medicine

## 2024-03-27 DIAGNOSIS — Z87891 Personal history of nicotine dependence: Secondary | ICD-10-CM

## 2024-03-27 DIAGNOSIS — Z8673 Personal history of transient ischemic attack (TIA), and cerebral infarction without residual deficits: Secondary | ICD-10-CM

## 2024-03-27 DIAGNOSIS — G8929 Other chronic pain: Secondary | ICD-10-CM | POA: Diagnosis present

## 2024-03-27 DIAGNOSIS — I10 Essential (primary) hypertension: Secondary | ICD-10-CM | POA: Diagnosis not present

## 2024-03-27 DIAGNOSIS — F17210 Nicotine dependence, cigarettes, uncomplicated: Secondary | ICD-10-CM | POA: Diagnosis not present

## 2024-03-27 DIAGNOSIS — K5900 Constipation, unspecified: Secondary | ICD-10-CM | POA: Diagnosis not present

## 2024-03-27 DIAGNOSIS — K219 Gastro-esophageal reflux disease without esophagitis: Secondary | ICD-10-CM | POA: Diagnosis present

## 2024-03-27 DIAGNOSIS — M545 Low back pain, unspecified: Secondary | ICD-10-CM | POA: Diagnosis not present

## 2024-03-27 DIAGNOSIS — E559 Vitamin D deficiency, unspecified: Secondary | ICD-10-CM | POA: Diagnosis present

## 2024-03-27 DIAGNOSIS — E66812 Obesity, class 2: Secondary | ICD-10-CM | POA: Diagnosis present

## 2024-03-27 DIAGNOSIS — G4733 Obstructive sleep apnea (adult) (pediatric): Secondary | ICD-10-CM | POA: Diagnosis not present

## 2024-03-27 DIAGNOSIS — E785 Hyperlipidemia, unspecified: Secondary | ICD-10-CM | POA: Diagnosis not present

## 2024-03-27 DIAGNOSIS — Z6835 Body mass index (BMI) 35.0-35.9, adult: Secondary | ICD-10-CM | POA: Insufficient documentation

## 2024-03-27 DIAGNOSIS — R2681 Unsteadiness on feet: Secondary | ICD-10-CM | POA: Diagnosis not present

## 2024-03-27 DIAGNOSIS — R569 Unspecified convulsions: Secondary | ICD-10-CM | POA: Diagnosis not present

## 2024-03-27 DIAGNOSIS — I169 Hypertensive crisis, unspecified: Principal | ICD-10-CM | POA: Diagnosis present

## 2024-03-27 DIAGNOSIS — G459 Transient cerebral ischemic attack, unspecified: Secondary | ICD-10-CM | POA: Diagnosis present

## 2024-03-27 DIAGNOSIS — I160 Hypertensive urgency: Principal | ICD-10-CM

## 2024-03-27 DIAGNOSIS — H538 Other visual disturbances: Secondary | ICD-10-CM | POA: Diagnosis present

## 2024-03-27 DIAGNOSIS — Z79899 Other long term (current) drug therapy: Secondary | ICD-10-CM | POA: Diagnosis not present

## 2024-03-27 DIAGNOSIS — F17200 Nicotine dependence, unspecified, uncomplicated: Secondary | ICD-10-CM | POA: Diagnosis present

## 2024-03-27 LAB — COMPREHENSIVE METABOLIC PANEL WITH GFR
ALT: 22 U/L (ref 0–44)
AST: 22 U/L (ref 15–41)
Albumin: 3.7 g/dL (ref 3.5–5.0)
Alkaline Phosphatase: 83 U/L (ref 38–126)
Anion gap: 9 (ref 5–15)
BUN: 16 mg/dL (ref 8–23)
CO2: 25 mmol/L (ref 22–32)
Calcium: 9.3 mg/dL (ref 8.9–10.3)
Chloride: 106 mmol/L (ref 98–111)
Creatinine, Ser: 0.85 mg/dL (ref 0.44–1.00)
GFR, Estimated: >60 mL/min (ref >60)
Glucose, Bld: 87 mg/dL (ref 70–99)
Potassium: 3.8 mmol/L (ref 3.5–5.1)
Sodium: 140 mmol/L (ref 135–145)
Total Bilirubin: 0.2 mg/dL (ref 0.0–1.2)
Total Protein: 6.6 g/dL (ref 6.5–8.1)

## 2024-03-27 LAB — CBC WITH DIFFERENTIAL/PLATELET
Abs Immature Granulocytes: 0.01 10*3/uL (ref 0.00–0.07)
Basophils Absolute: 0.1 10*3/uL (ref 0.0–0.1)
Basophils Relative: 1 %
Eosinophils Absolute: 0.2 10*3/uL (ref 0.0–0.5)
Eosinophils Relative: 3 %
HCT: 37 % (ref 36.0–46.0)
Hemoglobin: 12 g/dL (ref 12.0–15.0)
Immature Granulocytes: 0 %
Lymphocytes Relative: 47 %
Lymphs Abs: 3.8 10*3/uL (ref 0.7–4.0)
MCH: 29.7 pg (ref 26.0–34.0)
MCHC: 32.4 g/dL (ref 30.0–36.0)
MCV: 91.6 fL (ref 80.0–100.0)
Monocytes Absolute: 0.7 10*3/uL (ref 0.1–1.0)
Monocytes Relative: 9 %
Neutro Abs: 3.2 10*3/uL (ref 1.7–7.7)
Neutrophils Relative %: 40 %
Platelets: 236 10*3/uL (ref 150–400)
RBC: 4.04 MIL/uL (ref 3.87–5.11)
RDW: 12.6 % (ref 11.5–15.5)
WBC: 7.9 10*3/uL (ref 4.0–10.5)
nRBC: 0 % (ref 0.0–0.2)

## 2024-03-27 MED ORDER — ACETAMINOPHEN 500 MG PO TABS
1000.0000 mg | ORAL_TABLET | Freq: Once | ORAL | Status: AC
  Administered 2024-03-27: 1000 mg via ORAL
  Filled 2024-03-27: qty 2

## 2024-03-27 MED ORDER — IOHEXOL 350 MG/ML SOLN
75.0000 mL | Freq: Once | INTRAVENOUS | Status: AC | PRN
Start: 2024-03-27 — End: 2024-03-27
  Administered 2024-03-27: 75 mL via INTRAVENOUS

## 2024-03-27 MED ORDER — LABETALOL HCL 5 MG/ML IV SOLN
10.0000 mg | Freq: Once | INTRAVENOUS | Status: AC
  Administered 2024-03-27: 10 mg via INTRAVENOUS
  Filled 2024-03-27: qty 4

## 2024-03-27 MED ORDER — ONDANSETRON HCL 4 MG/2ML IJ SOLN
4.0000 mg | Freq: Once | INTRAMUSCULAR | Status: AC
  Administered 2024-03-27: 4 mg via INTRAVENOUS
  Filled 2024-03-27: qty 2

## 2024-03-27 MED ORDER — METOCLOPRAMIDE HCL 5 MG/ML IJ SOLN
10.0000 mg | Freq: Once | INTRAMUSCULAR | Status: AC
  Administered 2024-03-27: 10 mg via INTRAVENOUS
  Filled 2024-03-27: qty 2

## 2024-03-27 NOTE — ED Notes (Signed)
 ED Provider at bedside.

## 2024-03-27 NOTE — ED Provider Notes (Signed)
 [place] EMERGENCY DEPARTMENT AT [facility] Provider Note  CSN: [id number] Arrival date & time: 03/27/24 1906  Chief Complaint(s) Blurred Vision and Numbness  HPI Sheryl Brown is a 63 y.o. female who is here today for headache, dizziness and blurred vision.  She has a history of hypertension, says that she takes her medications.  She says that over the last couple of days, she has noticed that she has had some increasing pain in her leg.  She says that she has also had occasional numbness in her tongue.  She does not have any numbness or tingling in her hands or weakness in her hands.  Patient states that the symptoms seem to come and go, but however began a few days ago.  At this time, patient is endorsing numbness at the tip of her tongue and blurred vision.  No unilateral deficits.   Past Medical History Past Medical History:  Diagnosis Date   GERD (gastroesophageal reflux disease)    Meningitis 2013   Neuropathy    Seasonal allergies    Seizures (HCC)    Stroke [facility])    Patient Active Problem List   Diagnosis Date Noted   Hypertensive crisis 03/27/2024   Vitamin D deficiency 09/01/2019   Seizure (HCC) 01/18/2019   TIA (transient ischemic attack) 01/18/2019   HTN (hypertension) 01/28/2018   Hyperlipidemia 06/03/2017   Chronic bilateral low back pain without sciatica 12/12/2016   URI 01/20/2009   TOBACCO ABUSE 12/08/2008   CANDIDIASIS, VAGINAL 10/21/2008   DEPRESSION, SITUATIONAL 05/31/2008   DENTAL PAIN 02/23/2008   ACUTE GINGIVITIS NONPLAQUE INDUCED 12/24/2007   INGUINAL PAIN, RIGHT 12/24/2007   UTI 11/26/2007   ROTATOR CUFF SYNDROME, RIGHT 07/24/2007   GERD 07/23/2007   ELEVATED BLOOD PRESSURE WITHOUT DIAGNOSIS OF HYPERTENSION 05/28/2007   SYMPTOM, PAIN, ABDOMINAL, GENERALIZED 11/25/2006   LABYRINTHITIS, HX OF 09/11/2006   Home Medication(s) Prior to Admission medications   Medication Sig Start Date End Date Taking? Authorizing Provider   amitriptyline  (ELAVIL ) 50 MG tablet Take 50 mg by mouth at bedtime. 01/11/22   [provider]  amLODipine  (NORVASC ) 10 MG tablet Take 1 tablet (10 mg total) by mouth daily. 12/24/23   Dalene Duck, MD  amoxicillin  (AMOXIL ) 500 MG capsule Take 1 capsule (500 mg total) by mouth 3 (three) times daily. 12/22/23   Levora Reas A, NP  atorvastatin  (LIPITOR) 20 MG tablet Take 1 tablet (20 mg total) by mouth daily. 07/22/21   Marius Siemens, NP  azithromycin  (ZITHROMAX  Z-PAK) 250 MG tablet Take 2 pills (500mg ) first day and one pill (250mg ) the remaining 4 days. 12/22/23   Levora Reas A, NP  baclofen  (LIORESAL ) 10 MG tablet Take 1 tablet (10 mg total) by mouth 3 (three) times daily. 12/22/23   Levora Reas A, NP  fluconazole  (DIFLUCAN ) 150 MG tablet Take 1 tablet (150 mg total) by mouth daily. Patient not taking: Reported on 11/25/2023 08/28/23   Harlow Lighter, [place]  N, FNP  gabapentin  (NEURONTIN ) 100 MG capsule TAKE 1 CAPSULE(100 MG) BY MOUTH AT BEDTIME 12/08/23   Cassandra Cleveland, MD  lamoTRIgine  (LAMICTAL ) 150 MG tablet Take 1 tablet (150 mg total) by mouth daily. 10/07/23 07/03/24  Camara, Amadou, MD  naproxen  (NAPROSYN ) 375 MG tablet Take 1 tablet (375 mg total) by mouth 2 (two) times daily. 12/25/21   Kommor, [place], MD  ondansetron  (ZOFRAN -ODT) 4 MG disintegrating tablet Take 1 tablet (4 mg total) by mouth every 8 (eight) hours as needed for nausea or vomiting. 05/29/23  Mordecai Applebaum, MD  pantoprazole  (PROTONIX ) 40 MG tablet Take 1 tablet (40 mg total) by mouth 2 (two) times daily. 06/02/23   Lucina Sabal, PA-C  PARoxetine (PAXIL) 20 MG tablet Take 20 mg by mouth daily. 10/03/21   [provider]  polyethylene glycol powder (MIRALAX ) 17 GM/SCOOP powder Take 17 g by mouth daily. Patient not taking: Reported on 11/25/2023 06/02/23   Lucina Sabal, PA-C  sucralfate  (CARAFATE ) 1 g tablet Take 1 tablet (1 g total) by mouth 4 (four) times daily. Patient not taking: Reported on  11/25/2023 05/27/23   Lind Repine, MD  SUMAtriptan  (IMITREX ) 50 MG tablet Take 1 tablet (50 mg total) by mouth every 2 (two) hours as needed. May repeat in 2 hours if headache persists or recurs. 10/29/23   Camara, Amadou, MD  valsartan  (DIOVAN ) 160 MG tablet TAKE 1 TABLET(160 MG) BY MOUTH DAILY Patient taking differently: Take 160 mg by mouth 2 (two) times daily. 09/12/21   Marius Siemens, NP                                                                                                                                    Past Surgical History Past Surgical History:  Procedure Laterality Date   CESAREAN SECTION     Family History Family History  Problem Relation Age of Onset   Heart failure Mother    Cirrhosis Father     Social History Social History   Tobacco Use   Smoking status: Every Day    Current packs/day: 0.25    Types: Cigarettes   Smokeless tobacco: Never  Vaping Use   Vaping status: Never Used  Substance Use Topics   Alcohol use: Yes    Comment: only on holidays   Drug use: Never   Allergies Gabapentin   Review of Systems Review of Systems  Physical Exam Vital Signs  I have reviewed the triage vital signs BP (!) 153/93   Pulse 66   Temp 98.4 F (36.9 C) (Oral)   Resp 17   SpO2 99%   Physical Exam Vitals and nursing note reviewed.  Constitutional:      Appearance: Normal appearance.  Eyes:     Pupils: Pupils are equal, round, and reactive to light.  Pulmonary:     Effort: Pulmonary effort is normal.  Abdominal:     General: Abdomen is flat. There is no distension.     Palpations: Abdomen is soft.     Tenderness: There is no abdominal tenderness. There is no guarding.  Musculoskeletal:        General: Normal range of motion.     Cervical back: Normal range of motion.  Neurological:     General: No focal deficit present.     Mental Status: She is alert and oriented to person, place, and time.     Cranial Nerves: No cranial nerve deficit.      Sensory: No  sensory deficit.     Motor: No weakness.     Gait: Gait normal.     ED Results and Treatments Labs (all labs ordered are listed, but only abnormal results are displayed) Labs Reviewed  CBC WITH DIFFERENTIAL/PLATELET  COMPREHENSIVE METABOLIC PANEL WITH GFR                                                                                                                          Radiology CT ANGIO HEAD NECK W WO CM Result Date: 03/27/2024 CLINICAL DATA:  Syncope/presyncope, cerebrovascular cause suspected EXAM: CT ANGIOGRAPHY HEAD AND NECK WITH AND WITHOUT CONTRAST TECHNIQUE: Multidetector CT imaging of the head and neck was performed using the standard protocol during bolus administration of intravenous contrast. Multiplanar CT image reconstructions and MIPs were obtained to evaluate the vascular anatomy. Carotid stenosis measurements (when applicable) are obtained utilizing NASCET criteria, using the distal internal carotid diameter as the denominator. RADIATION DOSE REDUCTION: This exam was performed according to the departmental dose-optimization program which includes automated exposure control, adjustment of the mA and/or kV according to patient size and/or use of iterative reconstruction technique. CONTRAST:  75mL OMNIPAQUE  IOHEXOL  350 MG/ML SOLN COMPARISON:  None Available. FINDINGS: CT HEAD FINDINGS Brain: No evidence of acute infarction, hemorrhage, hydrocephalus, extra-axial collection or mass lesion/mass effect. Vascular: See below. Skull: No acute fracture. Sinuses/Orbits: Clear sinuses.  No acute orbital findings. Review of the MIP images confirms the above findings CTA NECK FINDINGS Aortic arch: Great vessel origins are patent. Right carotid system: Atherosclerosis of the carotid bifurcation with approximately 50% stenosis of the IC artery Left carotid system: No evidence of dissection, stenosis (50% or greater), or occlusion. Vertebral arteries: Codominant. No evidence of  dissection, stenosis (50% or greater), or occlusion. Skeleton: No acute abnormality on limited assessment. Other neck: No acute abnormality on limited assessment. Upper chest: Visualized lung apices are clear. Review of the MIP images confirms the above findings CTA HEAD FINDINGS Anterior circulation: Bilateral intracranial ICAs, MCAs, and ACAs are patent without proximal hemodynamically significant stenosis. No aneurysm identified. Posterior circulation: Bilateral intradural vertebral arteries, basilar artery and bilateral posterior arch are patent without proximal hemodynamically significant stenosis. Right fetal type PCA and small vertebrobasilar system, anatomic variant. Venous sinuses: As permitted by contrast timing, patent. Anatomic variants: Detailed above. Review of the MIP images confirms the above findings IMPRESSION: 1. No large vessel occlusion. 2. 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 Electronically Signed   By: Stevenson Elbe M.D.   On: 03/27/2024 20:38    Pertinent labs & imaging results that were available during my care of the patient were reviewed by me and considered in my medical decision making (see MDM for details).  Medications Ordered in ED Medications  labetalol (NORMODYNE) injection 10 mg (10 mg Intravenous Given 03/27/24 1949)  ondansetron  (ZOFRAN ) injection 4 mg (4 mg Intravenous Given 03/27/24 1950)  iohexol  (OMNIPAQUE ) 350 MG/ML injection 75 mL (75 mLs Intravenous Contrast Given 03/27/24 2018)  metoCLOPramide (REGLAN) injection 10 mg (10 mg Intravenous Given 03/27/24 2058)  labetalol (NORMODYNE) injection 10 mg (10 mg Intravenous Given 03/27/24 2056)  acetaminophen  (TYLENOL ) tablet 1,000 mg (1,000 mg Oral Given 03/27/24 2157)                                                                                                                                     Procedures Procedures  (including critical care time)  Medical Decision Making / ED Course   This  patient presents to the ED for concern of blurred vision and headache, this involves an extensive number of treatment options, and is a complaint that carries with it a high risk of complications and morbidity.  The differential diagnosis includes hypertensive urgency, considered CVA, posterior CVA.  MDM: On exam, patient has no focal or lateralizing deficits.  She reports blurred vision, and states the tip of her tongue feels numb.  However, cranial nerves II through XII are intact.  She has a normal gait.  She has 5-5 upper extremity strength.  Normal heel-to-shin testing.  I do not identify any focal or lateralizing deficits on this patient, and it sounds as though her last known well was a few days ago.  I canceled the proposed code stroke from nursing staff as this patient was not a lytics candidate  I believe the patient's symptoms are likely hypertensive urgency.  CTA of the patient's head and neck were negative.  Will admit to hospitalist.   Additional history obtained: -Additional history obtained from daughter at bedside -External records from outside source obtained and reviewed including: Chart review including previous notes, labs, imaging, consultation notes   Lab Tests: -I ordered, reviewed, and interpreted labs.   The pertinent results include:   Labs Reviewed  CBC WITH DIFFERENTIAL/PLATELET  COMPREHENSIVE METABOLIC PANEL WITH GFR      EKG my independent review of the patient's EKG shows no ST segment depressions or elevations, no T wave inversions, no evidence of acute ischemia.  EKG Interpretation Date/Time:  Saturday Mar 27 2024 19:41:11 EDT Ventricular Rate:  72 PR Interval:  130 QRS Duration:  80 QT Interval:  375 QTC Calculation: 411 R Axis:   47  Text Interpretation: Sinus rhythm Low voltage, precordial leads Confirmed by Afton Horse [phone]) on 03/27/2024 9:25:18 PM         Imaging Studies ordered: I ordered imaging studies including CTA of the head  and neck I independently visualized and interpreted imaging. I agree with the radiologist interpretation   Medicines ordered and prescription drug management: Meds ordered this encounter  Medications   labetalol (NORMODYNE) injection 10 mg   ondansetron  (ZOFRAN ) injection 4 mg   iohexol  (OMNIPAQUE ) 350 MG/ML injection 75 mL   metoCLOPramide (REGLAN) injection 10 mg   labetalol (NORMODYNE) injection 10 mg   acetaminophen  (TYLENOL ) tablet 1,000 mg    -I have reviewed the patients home medicines and have made  adjustments as needed  Cardiac Monitoring: The patient was maintained on a cardiac monitor.  I personally viewed and interpreted the cardiac monitored which showed an underlying rhythm of: Normal sinus rhythm  Social Determinants of Health:  Factors impacting patients care include: Lack of access to primary care   Reevaluation: After the interventions noted above, I reevaluated the patient and found that they have :improved  Co morbidities that complicate the patient evaluation  Past Medical History:  Diagnosis Date   GERD (gastroesophageal reflux disease)    Meningitis 2013   Neuropathy    Seasonal allergies    Seizures (HCC)    Stroke [facility])       Dispostion: Adam patient     Final Clinical Impression(s) / ED Diagnoses Final diagnoses:  Hypertensive urgency     @PCDICTATION @    Afton Horse T, DO 03/27/24 2315

## 2024-03-27 NOTE — ED Triage Notes (Signed)
 Pt POV reporting blurred vision, dizziness, and numbness in tongue that began 1hr ago. Hx stroke, code stroke called in triage, cancelled at 55 by MD.

## 2024-03-27 NOTE — Progress Notes (Signed)
 1924 - Stroke Cart Activated  1925 - Dr. Florie Husband assessing   Blurred vision & tongue numbness began at 1830, leg numbness x 2 days   1928 - Cancelled by Dr. Florie Husband

## 2024-03-28 ENCOUNTER — Encounter (HOSPITAL_COMMUNITY): Payer: Self-pay | Admitting: Internal Medicine

## 2024-03-28 ENCOUNTER — Observation Stay (HOSPITAL_COMMUNITY)

## 2024-03-28 DIAGNOSIS — G4733 Obstructive sleep apnea (adult) (pediatric): Secondary | ICD-10-CM | POA: Diagnosis present

## 2024-03-28 DIAGNOSIS — H538 Other visual disturbances: Secondary | ICD-10-CM | POA: Diagnosis not present

## 2024-03-28 DIAGNOSIS — Z789 Other specified health status: Secondary | ICD-10-CM | POA: Diagnosis not present

## 2024-03-28 DIAGNOSIS — I160 Hypertensive urgency: Secondary | ICD-10-CM | POA: Diagnosis present

## 2024-03-28 DIAGNOSIS — I169 Hypertensive crisis, unspecified: Secondary | ICD-10-CM | POA: Diagnosis present

## 2024-03-28 DIAGNOSIS — R42 Dizziness and giddiness: Secondary | ICD-10-CM | POA: Diagnosis not present

## 2024-03-28 DIAGNOSIS — E66812 Obesity, class 2: Secondary | ICD-10-CM | POA: Diagnosis present

## 2024-03-28 DIAGNOSIS — G4489 Other headache syndrome: Secondary | ICD-10-CM | POA: Diagnosis not present

## 2024-03-28 DIAGNOSIS — E162 Hypoglycemia, unspecified: Secondary | ICD-10-CM | POA: Diagnosis not present

## 2024-03-28 MED ORDER — PANTOPRAZOLE SODIUM 40 MG PO TBEC
40.0000 mg | DELAYED_RELEASE_TABLET | Freq: Two times a day (BID) | ORAL | Status: DC
  Administered 2024-03-28 – 2024-03-31 (×7): 40 mg via ORAL
  Filled 2024-03-28 (×7): qty 1

## 2024-03-28 MED ORDER — LAMOTRIGINE 25 MG PO TABS
150.0000 mg | ORAL_TABLET | Freq: Every day | ORAL | Status: DC
  Administered 2024-03-28 – 2024-03-31 (×4): 150 mg via ORAL
  Filled 2024-03-28 (×4): qty 2

## 2024-03-28 MED ORDER — MECLIZINE HCL 25 MG PO TABS
25.0000 mg | ORAL_TABLET | Freq: Three times a day (TID) | ORAL | Status: DC | PRN
  Administered 2024-03-28 – 2024-03-30 (×4): 25 mg via ORAL
  Filled 2024-03-28 (×4): qty 1

## 2024-03-28 MED ORDER — NICOTINE 7 MG/24HR TD PT24
7.0000 mg | MEDICATED_PATCH | Freq: Once | TRANSDERMAL | Status: AC
  Administered 2024-03-28: 7 mg via TRANSDERMAL
  Filled 2024-03-28: qty 1

## 2024-03-28 MED ORDER — ONDANSETRON HCL 4 MG PO TABS
4.0000 mg | ORAL_TABLET | Freq: Four times a day (QID) | ORAL | Status: DC | PRN
Start: 2024-03-28 — End: 2024-03-31

## 2024-03-28 MED ORDER — ACETAMINOPHEN 325 MG PO TABS
650.0000 mg | ORAL_TABLET | Freq: Four times a day (QID) | ORAL | Status: DC | PRN
  Administered 2024-03-28 – 2024-03-31 (×6): 650 mg via ORAL
  Filled 2024-03-28 (×6): qty 2

## 2024-03-28 MED ORDER — DIAZEPAM 5 MG/ML IJ SOLN
2.5000 mg | Freq: Once | INTRAMUSCULAR | Status: AC | PRN
  Administered 2024-03-28: 2.5 mg via INTRAVENOUS
  Filled 2024-03-28: qty 2

## 2024-03-28 MED ORDER — KETOROLAC TROMETHAMINE 30 MG/ML IJ SOLN
15.0000 mg | Freq: Once | INTRAMUSCULAR | Status: AC
  Administered 2024-03-28: 15 mg via INTRAVENOUS
  Filled 2024-03-28: qty 1

## 2024-03-28 MED ORDER — ONDANSETRON HCL 4 MG/2ML IJ SOLN: 4.0000 mg | Freq: Four times a day (QID) | INTRAMUSCULAR | Status: DC | PRN

## 2024-03-28 MED ORDER — ACETAMINOPHEN 650 MG RE SUPP: 650.0000 mg | Freq: Four times a day (QID) | RECTAL | Status: DC | PRN

## 2024-03-28 MED ORDER — ATORVASTATIN CALCIUM 20 MG PO TABS
20.0000 mg | ORAL_TABLET | Freq: Every day | ORAL | Status: DC
  Administered 2024-03-28 – 2024-03-31 (×4): 20 mg via ORAL
  Filled 2024-03-28 (×4): qty 1

## 2024-03-28 MED ORDER — NICOTINE 7 MG/24HR TD PT24
7.0000 mg | MEDICATED_PATCH | Freq: Every day | TRANSDERMAL | Status: DC
  Administered 2024-03-28 – 2024-03-31 (×4): 7 mg via TRANSDERMAL
  Filled 2024-03-28 (×4): qty 1

## 2024-03-28 MED ORDER — ASPIRIN 325 MG PO TBEC
325.0000 mg | DELAYED_RELEASE_TABLET | Freq: Once | ORAL | Status: AC
  Administered 2024-03-28: 325 mg via ORAL
  Filled 2024-03-28: qty 1

## 2024-03-28 MED ORDER — ASPIRIN 81 MG PO TBEC
81.0000 mg | DELAYED_RELEASE_TABLET | Freq: Every day | ORAL | Status: DC
  Administered 2024-03-29 – 2024-03-31 (×3): 81 mg via ORAL
  Filled 2024-03-28 (×3): qty 1

## 2024-03-28 MED ORDER — ENOXAPARIN SODIUM 40 MG/0.4ML IJ SOSY
40.0000 mg | PREFILLED_SYRINGE | INTRAMUSCULAR | Status: DC
  Administered 2024-03-28 – 2024-03-30 (×3): 40 mg via SUBCUTANEOUS
  Filled 2024-03-28 (×3): qty 0.4

## 2024-03-28 NOTE — H&P (Signed)
 History and Physical    Patient: Sheryl Brown:[id number] DOB: 03-Mar-1961 DOA: 03/27/2024 DOS: the patient was seen and examined on 03/28/2024 PCP: Authur Leghorn, MD  Patient coming from: Home  Chief Complaint:  Chief Complaint  Patient presents with   Blurred Vision   Numbness   HPI: Sheryl Brown is a 63 y.o. female with medical history significant of GERD, meningitis, neuropathy, seasonal allergies, seizure disorder, history of nonhemorrhagic CVA, active tobacco abuse, gingivitis, chronic lower back pain without sciatica, osteoarthritis, adhesive capsulitis of the right shoulder, situational depression, GERD, hypertension, hyperlipidemia, obstructive sleep apnea, labyrinthitis, vertigo, UTI, vitamin D deficiency, class 2 obesity who presented to the emergency department for complaints of blurred vision, left facial and glossal numbness.  She has had some gait disturbances, this is due to left knee arthritis and chronic back pain.  Symptoms subsided last night.  She last took her medications on Friday.  She is not sure about the names of the medications.  She denied fever, chills, rhinorrhea, sore throat, wheezing or hemoptysis.  No chest pain, diaphoresis, PND, orthopnea or pitting edema of the lower extremities, but has had some palpitations.  No abdominal pain, nausea, emesis, diarrhea, constipation, melena or hematochezia.  No flank pain, dysuria, frequency or hematuria.  No polyuria, polydipsia, polyphagia or blurred vision.   I discussed with the patient the results of her sleep study and echocardiogram done on the lung health in February.  According to the patient, she was not set up for a follow-up appointment.  Lab work: CBC and CMP were normal.  Imaging: CTA head and neck with no large vessel occlusion.  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   ED course: Initial vital signs were temperature 98.4 F, pulse 74, respiration 19, BP 190/109 mmHg O2 sat  100% on room air.  The patient received acetaminophen  1000 mg p.o. x 1, ketorolac  15 mg IVP x 1, labetalol 10 mg IVP x 2, metoclopramide 10 mg IVP and Zofran  4 mg IVP.  Review of Systems: As mentioned in the history of present illness. All other systems reviewed and are negative. Past Medical History:  Diagnosis Date   GERD (gastroesophageal reflux disease)    Meningitis 2013   Neuropathy    Seasonal allergies    Seizures (HCC)    Stroke [facility])    Past Surgical History:  Procedure Laterality Date   CESAREAN SECTION     Social History:  reports that she has been smoking cigarettes. She has never used smokeless tobacco. She reports current alcohol use. She reports that she does not use drugs.  Allergies  Allergen Reactions   Gabapentin      Other reaction(s): Other Fatigue and feeling "zombie like" and no help with nerves.     Family History  Problem Relation Age of Onset   Heart failure Mother    Cirrhosis Father     Prior to Admission medications   Medication Sig Start Date End Date Taking? Authorizing Provider  amitriptyline  (ELAVIL ) 50 MG tablet Take 50 mg by mouth at bedtime. 01/11/22   [provider]  amLODipine  (NORVASC ) 10 MG tablet Take 1 tablet (10 mg total) by mouth daily. 12/24/23   Dalene Duck, MD  amoxicillin  (AMOXIL ) 500 MG capsule Take 1 capsule (500 mg total) by mouth 3 (three) times daily. 12/22/23   Levora Reas A, NP  atorvastatin  (LIPITOR) 20 MG tablet Take 1 tablet (20 mg total) by mouth daily. 07/22/21  Marius Siemens, NP  azithromycin  (ZITHROMAX  Z-PAK) 250 MG tablet Take 2 pills (500mg ) first day and one pill (250mg ) the remaining 4 days. 12/22/23   Levora Reas A, NP  baclofen  (LIORESAL ) 10 MG tablet Take 1 tablet (10 mg total) by mouth 3 (three) times daily. 12/22/23   Levora Reas A, NP  fluconazole  (DIFLUCAN ) 150 MG tablet Take 1 tablet (150 mg total) by mouth daily. Patient not taking: Reported on 11/25/2023 08/28/23    Harlow Lighter, [place]  N, FNP  gabapentin  (NEURONTIN ) 100 MG capsule TAKE 1 CAPSULE(100 MG) BY MOUTH AT BEDTIME 12/08/23   Cassandra Cleveland, MD  lamoTRIgine  (LAMICTAL ) 150 MG tablet Take 1 tablet (150 mg total) by mouth daily. 10/07/23 07/03/24  Camara, Amadou, MD  naproxen  (NAPROSYN ) 375 MG tablet Take 1 tablet (375 mg total) by mouth 2 (two) times daily. 12/25/21   Kommor, Madison, MD  ondansetron  (ZOFRAN -ODT) 4 MG disintegrating tablet Take 1 tablet (4 mg total) by mouth every 8 (eight) hours as needed for nausea or vomiting. 05/29/23   Mordecai Applebaum, MD  pantoprazole  (PROTONIX ) 40 MG tablet Take 1 tablet (40 mg total) by mouth 2 (two) times daily. 06/02/23   Lucina Sabal, PA-C  PARoxetine (PAXIL) 20 MG tablet Take 20 mg by mouth daily. 10/03/21   [provider]  polyethylene glycol powder (MIRALAX ) 17 GM/SCOOP powder Take 17 g by mouth daily. Patient not taking: Reported on 11/25/2023 06/02/23   Lucina Sabal, PA-C  sucralfate  (CARAFATE ) 1 g tablet Take 1 tablet (1 g total) by mouth 4 (four) times daily. Patient not taking: Reported on 11/25/2023 05/27/23   Lind Repine, MD  SUMAtriptan  (IMITREX ) 50 MG tablet Take 1 tablet (50 mg total) by mouth every 2 (two) hours as needed. May repeat in 2 hours if headache persists or recurs. 10/29/23   Camara, Amadou, MD  valsartan  (DIOVAN ) 160 MG tablet TAKE 1 TABLET(160 MG) BY MOUTH DAILY Patient taking differently: Take 160 mg by mouth 2 (two) times daily. 09/12/21   Marius Siemens, NP    Physical Exam: Vitals:   03/28/24 0500 03/28/24 0515 03/28/24 0601 03/28/24 0612  BP: 135/79 136/65 137/75   Pulse: 72 77 64   Resp: 14 15 20    Temp:   97.8 F (36.6 C)   TempSrc:   Oral   SpO2: 98% 100% 100%   Weight:    79.5 kg  Height:    4\' 11"  (1.499 m)   Physical Exam Vitals and nursing note reviewed.  Constitutional:      General: She is awake. She is not in acute distress.    Appearance: Normal appearance. She is obese. She is ill-appearing.   HENT:     Head: Normocephalic.     Nose: No rhinorrhea.     Mouth/Throat:     Mouth: Mucous membranes are moist.  Eyes:     General: No scleral icterus.    Pupils: Pupils are equal, round, and reactive to light.  Neck:     Vascular: No JVD.  Cardiovascular:     Rate and Rhythm: Normal rate and regular rhythm.     Heart sounds: S1 normal and S2 normal.  Pulmonary:     Effort: Pulmonary effort is normal.     Breath sounds: No wheezing, rhonchi or rales.  Abdominal:     General: Abdomen is protuberant. Bowel sounds are normal. There is no distension.     Palpations: Abdomen is soft.     Tenderness: There is no  abdominal tenderness. There is no right CVA tenderness or left CVA tenderness.  Musculoskeletal:     Cervical back: Neck supple.     Right lower leg: No edema.     Left lower leg: No edema.  Skin:    General: Skin is warm and dry.  Neurological:     Mental Status: She is alert and oriented to person, place, and time. Mental status is at baseline.  Psychiatric:        Mood and Affect: Mood normal.        Behavior: Behavior normal. Behavior is cooperative.     Data Reviewed:  Results are pending, will review when available. 12/14/2023 polysomnogram done at [facility].  CLINICAL INTERPRETATION: The nocturnal polysomnogram shows mild obstructive sleep apnea (OSA) with a total AHI of 6.2 events per hour of sleep with mild oxygen desaturation with O2 nadir of 81%. Moderate snoring was observed. No significant PLMs.  Recommendation; PAP therapy is an option for the treatment of mild OSA if patient has other medical or psychological comorbidities i.e. hypertension, coronary artery diseases, stroke, anxiety, depression, insomnia or history of excessive daytime sleepiness. Other treatment options include ENT evaluation for upper airway intervention or dental evaluation for oral appliance, weight reduction, avoid supine sleep may be beneficial.  Electronically signed by:  Christiana Cower, MD Board Certified: Neurology, Sleep Medicine Signed date and time: 12/20/2023 9:37 PM    01/09/2024 echocardiogram complete done at [facility]. Impression  Left Ventricle: Left ventricle size is normal.   Left Ventricle: Systolic function is normal. EF: 55-60%. Quantitative analysis of left ventricular Global Longitudinal Strain (GLS) imaging is -16.900%. Ejection fraction measured by 3D is 55%, which is normal.   Right Ventricle: Right ventricle size is normal.   Right Ventricle: Systolic function is normal.  EKG: Vent. rate 72 BPM PR interval 130 ms QRS duration 80 ms QT/QTcB 375/411 ms P-R-T axes 75 47 36 Sinus rhythm Low voltage, precordial leads  Assessment and Plan: Principal Problem:   Hypertensive crisis Observation/telemetry. Continue to monitor blood pressure closely. Smoking cessation advised. Weight loss advised. Will resume antihypertensives after brain MRI. Advised to to follow-up with PCP in 7 to 14 days.  Active Problems:   History of CVA (cerebrovascular accident) And history of:   TIA (transient ischemic attack) Resume aspirin   Has history of GERD - Will do enteric-coated aspirin  Neuro checks every 4 hours. Consult PT. Check fasting lipids. Check hemoglobin A1c. Had a CTA neck in the ED. Had recent echocardiogram. Check MRI of brain. Risk factors modifications. Discussed with neurologist on-call. - If MRI positive for CVA will need [facility] transfer/neurology consult.    Seizure [facility]) Per patient, last obvious medication on Friday. Resume lamotrigine  150 mg p.o. daily.    TOBACCO ABUSE   Personal history of nicotine dependence Tobacco cessation advised. Nicotine replacement therapy ordered.    GERD Continue pantoprazole  40 mg p.o. twice daily.    HTN (hypertension) Did not take antihypertensives yesterday. Will resume antihypertensives after MRI.    Hyperlipidemia Atorvastatin  20 mg p.o. daily.    Vitamin D  deficiency Advised to supplement vitamin D regularly.    Chronic bilateral low back pain without sciatica Analgesics as needed.    Class 2 obesity Current BMI 35.40 kg/m. Would benefit from lifestyle modifications. Follow-up closely with PCP and/or bariatric clinic.    OSA (obstructive sleep apnea) Willing to try CPAP while in the hospital.     Advance Care Planning:   Code Status: Full Code  Consults:   Family Communication:   Severity of Illness: The appropriate patient status for this patient is OBSERVATION. Observation status is judged to be reasonable and necessary in order to provide the required intensity of service to ensure the patient's safety. The patient's presenting symptoms, physical exam findings, and initial radiographic and laboratory data in the context of their medical condition is felt to place them at decreased risk for further clinical deterioration. Furthermore, it is anticipated that the patient will be medically stable for discharge from the hospital within 2 midnights of admission.   Author: Danice Dural, MD 03/28/2024 8:02 AM  For on call review [email or web address].   This document was prepared using Dragon voice recognition software and may contain some unintended transcription errors.

## 2024-03-28 NOTE — Care Management Obs Status (Signed)
 MEDICARE OBSERVATION STATUS NOTIFICATION   Patient Details  Name: Sheryl Brown MRN: [id number] Date of Birth: 1961/01/27   Medicare Observation Status Notification Given:  Yes    Bari Leys, RN 03/28/2024, 10:09 AM

## 2024-03-28 NOTE — Progress Notes (Signed)
  Patient received from [facility] via [facility] transport.    03/28/24 0601  Vitals  Temp 97.8 F (36.6 C)  Temp Source Oral  BP 137/75  MAP (mmHg) 95  BP Location Right Arm  BP Method Automatic  Patient Position (if appropriate) Lying  Pulse Rate 64  Pulse Rate Source Monitor  Resp 20  MEWS COLOR  MEWS Score Color Green  Oxygen Therapy  SpO2 100 %  O2 Device Room Air

## 2024-03-28 NOTE — Progress Notes (Signed)
 The rest of 1x dose of valium PRN wasted in [place] steri-cycle and pyxis w/ Jasmina C., RN.

## 2024-03-28 NOTE — TOC Initial Note (Signed)
 Transition of Care [facility]) - Initial/Assessment Note    Patient Details  Name: Sheryl Brown MRN: [id number] Date of Birth: 14-Jun-1961  Transition of Care [facility]) CM/SW Contact:    Bari Leys, RN Phone Number: 03/28/2024, 11:14 AM  Clinical Narrative: Met with patient at bedside to introduce role of TOC/NCM and review for dc planning, patient reports she has an established PCP and pharmacy, reports she has a HHA 62 hrs/wk, reports she uses a cane as needed for functional mobility, pt reports she feels safe returning home support from her family. MOON completed. PT eval pending, await recommendation. TOC will continue to follow.                    Expected Discharge Plan: Home w Home Health Services Barriers to Discharge: Continued Medical Work up   Patient Goals and CMS Choice Patient states their goals for this hospitalization and ongoing recovery are:: return home          Expected Discharge Plan and Services       Living arrangements for the past 2 months: Single Family Home                                      Prior Living Arrangements/Services Living arrangements for the past 2 months: Single Family Home Lives with:: Self Patient language and need for interpreter reviewed:: Yes Do you feel safe going back to the place where you live?: Yes      Need for Family Participation in Patient Care: Yes (Comment) Care giver support system in place?: Yes (comment)   Criminal Activity/Legal Involvement Pertinent to Current Situation/Hospitalization: No - Comment as needed  Activities of Daily Living   ADL Screening (condition at time of admission) Independently performs ADLs?: No Does the patient have a NEW difficulty with bathing/dressing/toileting/self-feeding that is expected to last >3 days?: No Does the patient have a NEW difficulty with getting in/out of bed, walking, or climbing stairs that is expected to last >3 days?: Yes (Initiates electronic notice to  provider for possible PT consult) Does the patient have a NEW difficulty with communication that is expected to last >3 days?: No Is the patient deaf or have difficulty hearing?: No Does the patient have difficulty seeing, even when wearing glasses/contacts?: No Does the patient have difficulty concentrating, remembering, or making decisions?: No  Permission Sought/Granted                  Emotional Assessment Appearance:: Appears stated age Attitude/Demeanor/Rapport: Engaged Affect (typically observed): Accepting Orientation: : Oriented to Self, Oriented to Place, Oriented to  Time, Oriented to Situation Alcohol / Substance Use: Not Applicable Psych Involvement: No (comment)  Admission diagnosis:  Hypertensive crisis [I16.9] Hypertensive urgency [I16.0] Patient Active Problem List   Diagnosis Date Noted   Class 2 obesity 03/28/2024   OSA (obstructive sleep apnea) 03/28/2024   Hypertensive crisis 03/27/2024   Severe obesity (BMI 35.0-39.9) with comorbidity (HCC) 11/22/2022   Adhesive capsulitis of right shoulder 10/26/2021   History of CVA (cerebrovascular accident) 10/03/2021   Vitamin D deficiency 09/01/2019   Seizure (HCC) 01/18/2019   TIA (transient ischemic attack) 01/18/2019   Foot arthropathy 04/07/2018   HTN (hypertension) 01/28/2018   Vertigo 01/28/2018   Hyperlipidemia 06/03/2017   Chronic bilateral low back pain without sciatica 12/12/2016   Chronic sinusitis 12/12/2016   Personal history of nicotine dependence 12/12/2016  URI 01/20/2009   TOBACCO ABUSE 12/08/2008   CANDIDIASIS, VAGINAL 10/21/2008   DEPRESSION, SITUATIONAL 05/31/2008   DENTAL PAIN 02/23/2008   ACUTE GINGIVITIS NONPLAQUE INDUCED 12/24/2007   INGUINAL PAIN, RIGHT 12/24/2007   UTI 11/26/2007   ROTATOR CUFF SYNDROME, RIGHT 07/24/2007   GERD 07/23/2007   ELEVATED BLOOD PRESSURE WITHOUT DIAGNOSIS OF HYPERTENSION 05/28/2007   SYMPTOM, PAIN, ABDOMINAL, GENERALIZED 11/25/2006   LABYRINTHITIS,  HX OF 09/11/2006   PCP:  Authur Leghorn, MD Pharmacy:   [facility] [phone] - [place], [place] - [address] AT [address] [place] [address] Phone: [phone] Fax: [phone]  [facility] [place] [facility] [address] [place] [address] Phone: [phone] Fax: [phone]     Social Drivers of Health (SDOH) Social History: SDOH Screenings   Food Insecurity: No Food Insecurity (03/28/2024)  Housing: Low Risk  (03/28/2024)  Transportation Needs: No Transportation Needs (03/28/2024)  Utilities: Not At Risk (03/28/2024)  Financial Resource Strain: Medium Risk (11/20/2023)   Received from [facility]  Physical Activity: Unknown (10/06/2023)   Received from [facility]  Social Connections: Somewhat Isolated (06/30/2023)   Received from [facility]  Stress: Stress Concern Present (03/16/2024)   Received from [facility]  Tobacco Use: High Risk (03/28/2024)   SDOH Interventions:     Readmission Risk Interventions     No data to display

## 2024-03-28 NOTE — Progress Notes (Signed)
   03/28/24 2302  BiPAP/CPAP/SIPAP  $ Non-Invasive Home Ventilator  Initial  $ Face Mask Large  Yes  BiPAP/CPAP/SIPAP Pt Type Adult  BiPAP/CPAP/SIPAP Resmed  Mask Type Full face mask  Dentures removed? Not applicable  Mask Size Large  FiO2 (%) 21 %  Patient Home Machine No  Patient Home Mask No  Patient Home Tubing No  Auto Titrate Yes  Minimum cmH2O 4 cmH2O  Maximum cmH2O 15 cmH2O  Nasal massage performed Yes  CPAP/SIPAP surface wiped down Yes  Device Plugged into RED Power Outlet Yes  BiPAP/CPAP /SiPAP Vitals  Resp 15  MEWS Score/Color  MEWS Score 0  MEWS Score Color Green

## 2024-03-28 NOTE — ED Notes (Signed)
 Patient transported by [facility] at this time

## 2024-03-28 NOTE — ED Notes (Signed)
 Patient assisted to restroom via wheelchair

## 2024-03-29 DIAGNOSIS — I169 Hypertensive crisis, unspecified: Secondary | ICD-10-CM | POA: Diagnosis not present

## 2024-03-29 LAB — COMPREHENSIVE METABOLIC PANEL WITH GFR
ALT: 21 U/L (ref 0–44)
AST: 16 U/L (ref 15–41)
Albumin: 3.3 g/dL — ABNORMAL LOW (ref 3.5–5.0)
Alkaline Phosphatase: 64 U/L (ref 38–126)
Anion gap: 10 (ref 5–15)
BUN: 20 mg/dL (ref 8–23)
CO2: 23 mmol/L (ref 22–32)
Calcium: 9 mg/dL (ref 8.9–10.3)
Chloride: 105 mmol/L (ref 98–111)
Creatinine, Ser: 0.96 mg/dL (ref 0.44–1.00)
GFR, Estimated: >60 mL/min (ref >60)
Glucose, Bld: 92 mg/dL (ref 70–99)
Potassium: 4.2 mmol/L (ref 3.5–5.1)
Sodium: 138 mmol/L (ref 135–145)
Total Bilirubin: 0.6 mg/dL (ref 0.0–1.2)
Total Protein: 6.5 g/dL (ref 6.5–8.1)

## 2024-03-29 LAB — CBC
HCT: 35.9 % — ABNORMAL LOW (ref 36.0–46.0)
Hemoglobin: 11.4 g/dL — ABNORMAL LOW (ref 12.0–15.0)
MCH: 30.2 pg (ref 26.0–34.0)
MCHC: 31.8 g/dL (ref 30.0–36.0)
MCV: 95.2 fL (ref 80.0–100.0)
Platelets: 211 10*3/uL (ref 150–400)
RBC: 3.77 MIL/uL — ABNORMAL LOW (ref 3.87–5.11)
RDW: 13 % (ref 11.5–15.5)
WBC: 6.6 10*3/uL (ref 4.0–10.5)
nRBC: 0 % (ref 0.0–0.2)

## 2024-03-29 LAB — HIV ANTIBODY (ROUTINE TESTING W REFLEX): HIV Screen 4th Generation wRfx: NONREACTIVE

## 2024-03-29 MED ORDER — DIPHENHYDRAMINE HCL 50 MG/ML IJ SOLN
25.0000 mg | Freq: Once | INTRAMUSCULAR | Status: AC
  Administered 2024-03-29: 25 mg via INTRAVENOUS
  Filled 2024-03-29: qty 1

## 2024-03-29 MED ORDER — SUMATRIPTAN SUCCINATE 50 MG PO TABS
50.0000 mg | ORAL_TABLET | ORAL | Status: AC | PRN
  Administered 2024-03-29 (×2): 50 mg via ORAL
  Filled 2024-03-29 (×3): qty 1

## 2024-03-29 MED ORDER — METOCLOPRAMIDE HCL 5 MG/ML IJ SOLN
5.0000 mg | Freq: Once | INTRAMUSCULAR | Status: AC
  Administered 2024-03-29: 5 mg via INTRAVENOUS
  Filled 2024-03-29: qty 2

## 2024-03-29 MED ORDER — POLYETHYLENE GLYCOL 3350 17 G PO PACK
17.0000 g | PACK | Freq: Every day | ORAL | Status: DC | PRN
  Administered 2024-03-29 – 2024-03-30 (×2): 17 g via ORAL
  Filled 2024-03-29 (×2): qty 1

## 2024-03-29 MED ORDER — KETOROLAC TROMETHAMINE 15 MG/ML IJ SOLN
15.0000 mg | Freq: Once | INTRAMUSCULAR | Status: AC
  Administered 2024-03-29: 15 mg via INTRAVENOUS
  Filled 2024-03-29: qty 1

## 2024-03-29 MED ORDER — BUPROPION HCL ER (XL) 150 MG PO TB24
150.0000 mg | ORAL_TABLET | Freq: Every day | ORAL | Status: DC
  Administered 2024-03-29 – 2024-03-31 (×3): 150 mg via ORAL
  Filled 2024-03-29 (×3): qty 1

## 2024-03-29 MED ORDER — AMLODIPINE BESYLATE 5 MG PO TABS
10.0000 mg | ORAL_TABLET | Freq: Every day | ORAL | Status: DC
  Administered 2024-03-29 – 2024-03-31 (×3): 10 mg via ORAL
  Filled 2024-03-29 (×3): qty 2

## 2024-03-29 NOTE — Evaluation (Signed)
 Physical Therapy Evaluation Patient Details Name: Sheryl Brown MRN: [id number] DOB: October 02, 1961 Today's Date: 03/29/2024  History of Present Illness  63 yo female admitted with hypertensive crisis. Hx of obesity, OSA, vertigo, TIA, Sz, chronic pain, CVA, meningitis, neuropathy  Clinical Impression  On eval, pt was CGA for mobility. She walked ~85 feet with a RW. She has been mobilizing in her room unassisted with use of RW as well. Pt c/o L knee pain/popping with ambulation. Will recommend HHPT f/u and RW.         If plan is discharge home, recommend the following: A little help with walking and/or transfers;A little help with bathing/dressing/bathroom;Assist for transportation;Assistance with cooking/housework;Help with stairs or ramp for entrance   Can travel by private vehicle        Equipment Recommendations Rolling Spillers (2 wheels)  Recommendations for Other Services       Functional Status Assessment Patient has had a recent decline in their functional status and demonstrates the ability to make significant improvements in function in a reasonable and predictable amount of time.     Precautions / Restrictions Restrictions Weight Bearing Restrictions Per Provider Order: No      Mobility  Bed Mobility Overal bed mobility: Modified Independent                  Transfers Overall transfer level: Needs assistance Equipment used: Rolling [place] (2 wheels) Transfers: Sit to/from Stand Sit to Stand: Supervision                Ambulation/Gait Ambulation/Gait assistance: Contact guard assist Gait Distance (Feet): 85 Feet Assistive device: Rolling Overfield (2 wheels) Gait Pattern/deviations: Step-through pattern       General Gait Details: Pt c/o L knee pain with ambulation-chronic-plans to see ortho MD as outpatient. No c/o dizziness.  Stairs            Wheelchair Mobility     Tilt Bed    Modified Rankin (Stroke Patients Only)        Balance Overall balance assessment: Needs assistance         Standing balance support: During functional activity, Reliant on assistive device for balance Standing balance-Leahy Scale: Good                               Pertinent Vitals/Pain Pain Assessment Pain Assessment: Faces Faces Pain Scale: Hurts little more Pain Location: L knee with ambulation Pain Descriptors / Indicators: Sharp Pain Intervention(s): Limited activity within patient's tolerance, Monitored during session, Repositioned    Home Living Family/patient expects to be discharged to:: Private residence Living Arrangements: Spouse/significant other;Children   Type of Home: Apartment Home Access: Stairs to enter       Home Layout: Multi-level Home Equipment: Jeananne Mighty - single point      Prior Function Prior Level of Function : Independent/Modified Independent                     Extremity/Trunk Assessment   Upper Extremity Assessment Upper Extremity Assessment: Overall WFL for tasks assessed    Lower Extremity Assessment Lower Extremity Assessment: Generalized weakness    Cervical / Trunk Assessment Cervical / Trunk Assessment: Normal  Communication   Communication Communication: No apparent difficulties    Cognition Arousal: Alert Behavior During Therapy: WFL for tasks assessed/performed   PT - Cognitive impairments: No apparent impairments  Following commands: Intact       Cueing Cueing Techniques: Verbal cues     General Comments      Exercises     Assessment/Plan    PT Assessment Patient needs continued PT services  PT Problem List Decreased activity tolerance;Decreased balance;Decreased mobility;Decreased knowledge of use of DME;Pain       PT Treatment Interventions DME instruction;Gait training;Functional mobility training;Therapeutic exercise;Therapeutic activities;Patient/family education;Balance training    PT  Goals (Current goals can be found in the Care Plan section)  Acute Rehab PT Goals Patient Stated Goal: feel better PT Goal Formulation: With patient Time For Goal Achievement: 04/12/24 Potential to Achieve Goals: Good    Frequency Min 3X/week     Co-evaluation               AM-PAC PT "6 Clicks" Mobility  Outcome Measure Help needed turning from your back to your side while in a flat bed without using bedrails?: None Help needed moving from lying on your back to sitting on the side of a flat bed without using bedrails?: None Help needed moving to and from a bed to a chair (including a wheelchair)?: None Help needed standing up from a chair using your arms (e.g., wheelchair or bedside chair)?: None Help needed to walk in hospital room?: A Little Help needed climbing 3-5 steps with a railing? : A Little 6 Click Score: 22    End of Session Equipment Utilized During Treatment: Gait belt Activity Tolerance: Patient tolerated treatment well Patient left: in bed;with call bell/phone within reach;with family/visitor present   PT Visit Diagnosis: Difficulty in walking, not elsewhere classified (R26.2);Unsteadiness on feet (R26.81)    Time: 1610-9604 PT Time Calculation (min) (ACUTE ONLY): 13 min   Charges:   PT Evaluation $PT Eval Low Complexity: 1 Low   PT General Charges $$ ACUTE PT VISIT: 1 Visit            Tanda Falter, PT Acute Rehabilitation  Office: [phone]

## 2024-03-29 NOTE — TOC Progression Note (Signed)
 Transition of Care [facility]) - Progression Note   Patient Details  Name: Sheryl Brown MRN: [id number] Date of Birth: April 18, 1961  Transition of Care [facility]) CM/SW Contact  Zenon Hilda, LCSW Phone Number: 03/29/2024, 1:35 PM  Clinical Narrative: [facility] consulted for a CPAP as patient had a sleep study done in February, but never got set up with a CPAP. CSW made referral to [facility] with [employer]. Per Harriet Limber, [employer] will need documentation from prior to the sleep study, the sleep study, and an order with settings or supplies. CSW followed up with patient regarding trying to set up the CPAP. [employer] to update CSW if any additional documentation is needed for the CPAP.  Expected Discharge Plan: Home w Home Health Services Barriers to Discharge: Continued Medical Work up  Expected Discharge Plan and Services Living arrangements for the past 2 months: Single Family Home           DME Arranged: Continuous positive airway pressure (CPAP) DME Agency: [facility] Date DME Agency Contacted: 03/29/24 Time DME Agency Contacted: [id number] Representative spoke with at DME Agency: Mitch  Social Determinants of Health (SDOH) Interventions SDOH Screenings   Food Insecurity: No Food Insecurity (03/28/2024)  Housing: Low Risk  (03/28/2024)  Transportation Needs: No Transportation Needs (03/28/2024)  Utilities: Not At Risk (03/28/2024)  Financial Resource Strain: Medium Risk (11/20/2023)   Received from [facility]  Physical Activity: Unknown (10/06/2023)   Received from [facility]  Social Connections: Somewhat Isolated (06/30/2023)   Received from [facility]  Stress: Stress Concern Present (03/16/2024)   Received from [facility]  Tobacco Use: High Risk (03/28/2024)   Readmission Risk Interventions     No data to display

## 2024-03-29 NOTE — Progress Notes (Signed)
 PROGRESS NOTE    Sheryl Brown  ZOX:[id number] DOB: 1961-02-02 DOA: 03/27/2024 PCP: Authur Leghorn, MD    Brief Narrative:  Sheryl Brown is a 63 y.o. female with medical history significant of GERD, meningitis, neuropathy, seasonal allergies, seizure disorder, history of nonhemorrhagic CVA, active tobacco abuse, gingivitis, chronic lower back pain without sciatica, osteoarthritis, adhesive capsulitis of the right shoulder, situational depression, GERD, hypertension, hyperlipidemia, obstructive sleep apnea, labyrinthitis, vertigo, UTI, vitamin D deficiency, class 2 obesity who presented to the emergency department for complaints of blurred vision, left facial and glossal numbness.  She has had some gait disturbances, this is due to left knee arthritis    Assessment and Plan:  Hypertensive crisis -Smoking cessation advised. -Weight loss advised. -resume antihypertensives Advised to to follow-up with PCP in 7 to 14 days.   Active Problems:   History of CVA (cerebrovascular accident) And history of:   TIA (transient ischemic attack) Resume aspirin   MRI negative     Seizure (HCC) Resume lamotrigine  150 mg p.o. daily.     TOBACCO ABUSE   Personal history of nicotine  dependence Tobacco cessation advised. Nicotine  replacement therapy ordered.     GERD Continue pantoprazole  40 mg p.o. twice daily.     HTN (hypertension) -resume BP meds    Hyperlipidemia Atorvastatin  20 mg p.o. daily.     Vitamin D deficiency Advised to supplement vitamin D regularly.     Chronic bilateral low back pain without sciatica Analgesics as needed.     Class 2 obesity Estimated body mass index is 35.4 kg/m as calculated from the following:   Height as of this encounter: 4\' 11"  (1.499 m).   Weight as of this encounter: 79.5 kg.      OSA (obstructive sleep apnea) -thinks she feels better this AM after wearing the cPAP -TOC to arrange CPAP-- orders placed   DVT prophylaxis: enoxaparin   (LOVENOX ) injection 40 mg Start: 03/28/24 2200    Code Status: Full Code  Disposition Plan:  Level of care: Telemetry Cardiac Status is: Observation     Consultants:  TOC   Subjective: Still with headache  Objective: Vitals:   03/28/24 1831 03/28/24 2239 03/28/24 2302 03/29/24 0404  BP: 128/87 132/73  (!) 153/86  Pulse: 88 81  77  Resp: 18 16 15 16   Temp: 98.7 F (37.1 C) 98.3 F (36.8 C)  98.1 F (36.7 C)  TempSrc: Oral Oral  Oral  SpO2: 98% 100%  100%  Weight:      Height:        Intake/Output Summary (Last 24 hours) at 03/29/2024 1348 Last data filed at 03/28/2024 1800 Gross per 24 hour  Intake 240 ml  Output --  Net 240 ml   Filed Weights   03/28/24 0612  Weight: 79.5 kg    Examination:   General: Appearance:    Obese female in no acute distress     Lungs:     Clear to auscultation bilaterally, respirations unlabored  Heart:    Normal heart rate. Normal rhythm. No murmurs, rubs, or gallops.    MS:   All extremities are intact.    Neurologic:   Awake, alert, oriented x 3. No apparent focal neurological           defect.        Data Reviewed: I have personally reviewed following labs and imaging studies  CBC: Recent Labs  Lab 03/27/24 1942 03/29/24 0434  WBC 7.9 6.6  NEUTROABS 3.2  --  HGB 12.0 11.4*  HCT 37.0 35.9*  MCV 91.6 95.2  PLT 236 211   Basic Metabolic Panel: Recent Labs  Lab 03/27/24 2050 03/29/24 0434  NA 140 138  K 3.8 4.2  CL 106 105  CO2 25 23  GLUCOSE 87 92  BUN 16 20  CREATININE 0.85 0.96  CALCIUM  9.3 9.0   GFR: Estimated Creatinine Clearance: 54.6 mL/min (by C-G formula based on SCr of 0.96 mg/dL). Liver Function Tests: Recent Labs  Lab 03/27/24 2050 03/29/24 0434  AST 22 16  ALT 22 21  ALKPHOS 83 64  BILITOT 0.2 0.6  PROT 6.6 6.5  ALBUMIN 3.7 3.3*   No results for input(s): "LIPASE", "AMYLASE" in the last 168 hours. No results for input(s): "AMMONIA" in the last 168 hours. Coagulation  Profile: No results for input(s): "INR", "PROTIME" in the last 168 hours. Cardiac Enzymes: No results for input(s): "CKTOTAL", "CKMB", "CKMBINDEX", "TROPONINI" in the last 168 hours. BNP (last 3 results) No results for input(s): "PROBNP" in the last 8760 hours. HbA1C: No results for input(s): "HGBA1C" in the last 72 hours. CBG: No results for input(s): "GLUCAP" in the last 168 hours. Lipid Profile: No results for input(s): "CHOL", "HDL", "LDLCALC", "TRIG", "CHOLHDL", "LDLDIRECT" in the last 72 hours. Thyroid Function Tests: No results for input(s): "TSH", "T4TOTAL", "FREET4", "T3FREE", "THYROIDAB" in the last 72 hours. Anemia Panel: No results for input(s): "VITAMINB12", "FOLATE", "FERRITIN", "TIBC", "IRON", "RETICCTPCT" in the last 72 hours. Sepsis Labs: No results for input(s): "PROCALCITON", "LATICACIDVEN" in the last 168 hours.  No results found for this or any previous visit (from the past 240 hours).       Radiology Studies: MR BRAIN WO CONTRAST Result Date: 03/28/2024 CLINICAL DATA:  Neuro deficit, acute, stroke suspected EXAM: MRI HEAD WITHOUT CONTRAST TECHNIQUE: Multiplanar, multiecho pulse sequences of the brain and surrounding structures were obtained without intravenous contrast. COMPARISON:  CTA head neck Mar 27, 2024.  MRI head 06/08/2021. FINDINGS: Limited study due to artifact.  Within this limitation: Brain: No acute infarction, hemorrhage, hydrocephalus, extra-axial collection or mass lesion. Vascular: Normal flow voids. Skull and upper cervical spine: Normal marrow signal. Sinuses/Orbits: Clear sinuses.  No acute orbital findings. IMPRESSION: No evidence of acute intracranial abnormality. Electronically Signed   By: Stevenson Elbe M.D.   On: 03/28/2024 19:14   CT ANGIO HEAD NECK W WO CM Result Date: 03/27/2024 CLINICAL DATA:  Syncope/presyncope, cerebrovascular cause suspected EXAM: CT ANGIOGRAPHY HEAD AND NECK WITH AND WITHOUT CONTRAST TECHNIQUE: Multidetector CT  imaging of the head and neck was performed using the standard protocol during bolus administration of intravenous contrast. Multiplanar CT image reconstructions and MIPs were obtained to evaluate the vascular anatomy. Carotid stenosis measurements (when applicable) are obtained utilizing NASCET criteria, using the distal internal carotid diameter as the denominator. RADIATION DOSE REDUCTION: This exam was performed according to the departmental dose-optimization program which includes automated exposure control, adjustment of the mA and/or kV according to patient size and/or use of iterative reconstruction technique. CONTRAST:  75mL OMNIPAQUE  IOHEXOL  350 MG/ML SOLN COMPARISON:  None Available. FINDINGS: CT HEAD FINDINGS Brain: No evidence of acute infarction, hemorrhage, hydrocephalus, extra-axial collection or mass lesion/mass effect. Vascular: See below. Skull: No acute fracture. Sinuses/Orbits: Clear sinuses.  No acute orbital findings. Review of the MIP images confirms the above findings CTA NECK FINDINGS Aortic arch: Great vessel origins are patent. Right carotid system: Atherosclerosis of the carotid bifurcation with approximately 50% stenosis of the IC artery Left carotid system: No evidence of  dissection, stenosis (50% or greater), or occlusion. Vertebral arteries: Codominant. No evidence of dissection, stenosis (50% or greater), or occlusion. Skeleton: No acute abnormality on limited assessment. Other neck: No acute abnormality on limited assessment. Upper chest: Visualized lung apices are clear. Review of the MIP images confirms the above findings CTA HEAD FINDINGS Anterior circulation: Bilateral intracranial ICAs, MCAs, and ACAs are patent without proximal hemodynamically significant stenosis. No aneurysm identified. Posterior circulation: Bilateral intradural vertebral arteries, basilar artery and bilateral posterior arch are patent without proximal hemodynamically significant stenosis. Right fetal type  PCA and small vertebrobasilar system, anatomic variant. Venous sinuses: As permitted by contrast timing, patent. Anatomic variants: Detailed above. Review of the MIP images confirms the above findings IMPRESSION: 1. No large vessel occlusion. 2. 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 Electronically Signed   By: Stevenson Elbe M.D.   On: 03/27/2024 20:38        Scheduled Meds:  amLODipine   10 mg Oral Daily   aspirin  EC  81 mg Oral Daily   atorvastatin   20 mg Oral Daily   buPROPion   150 mg Oral Daily   enoxaparin  (LOVENOX ) injection  40 mg Subcutaneous Q24H   lamoTRIgine   150 mg Oral Daily   nicotine   7 mg Transdermal Daily   nicotine   7 mg Transdermal Once   pantoprazole   40 mg Oral BID   Continuous Infusions:   LOS: 0 days    Time spent: 45 minutes spent on chart review, discussion with nursing staff, consultants, updating family and interview/physical exam; more than 50% of that time was spent in counseling and/or coordination of care.    Enrigue Harvard, DO [facility] Available via Epic secure chat 7am-7pm After these hours, please refer to coverage provider listed on [email or web address] 03/29/2024, 1:48 PM

## 2024-03-30 DIAGNOSIS — I169 Hypertensive crisis, unspecified: Secondary | ICD-10-CM | POA: Diagnosis not present

## 2024-03-30 MED ORDER — SENNOSIDES-DOCUSATE SODIUM 8.6-50 MG PO TABS
1.0000 | ORAL_TABLET | Freq: Two times a day (BID) | ORAL | Status: DC
  Administered 2024-03-30 – 2024-03-31 (×2): 1 via ORAL
  Filled 2024-03-30 (×3): qty 1

## 2024-03-30 MED ORDER — LINACLOTIDE 145 MCG PO CAPS
290.0000 ug | ORAL_CAPSULE | Freq: Every day | ORAL | Status: DC
  Administered 2024-03-30 – 2024-03-31 (×2): 290 ug via ORAL
  Filled 2024-03-30 (×2): qty 2

## 2024-03-30 MED ORDER — SUMATRIPTAN SUCCINATE 50 MG PO TABS
50.0000 mg | ORAL_TABLET | ORAL | Status: AC | PRN
  Administered 2024-03-30 – 2024-03-31 (×2): 50 mg via ORAL
  Filled 2024-03-30 (×3): qty 1

## 2024-03-30 NOTE — TOC Progression Note (Signed)
 Transition of Care [facility]) - Progression Note   Patient Details  Name: Sheryl Brown MRN: [id number] Date of Birth: [birth year]  Transition of Care [facility]) CM/SW Contact  Zenon Hilda, LCSW Phone Number: 03/30/2024, 2:47 PM  Clinical Narrative: PT evaluation recommended HHPT and a rolling [place]. CSW met with patient to discuss recommendations. Patient is agreeable to HHPT being set up, but requested the referral not be sent to [facility]. Patient confirmed she needs a youth rolling [place]. CSW made rolling [place] referral to [facility] with [employer], which was accepted and [facility] was delivered to patient's room. CSW made [facility] referral to [facility], which was declined. CSW made HHPT referral to [facility] with [facility], which was accepted. CSW followed up with Mitch with [employer] and the authorization for CPAP is still pending. CSW updated patient.  Expected Discharge Plan: Home w Home Health Services Barriers to Discharge: Continued Medical Work up  Expected Discharge Plan and Services In-house Referral: Clinical Social Work Living arrangements for the past 2 months: Single Family Home            DME Arranged: Continuous positive airway pressure (CPAP), [facility] DME Agency: [facility] Date DME Agency Contacted: 03/30/24 Time DME Agency Contacted: 1308 Representative spoke with at DME Agency: Zack HH Arranged: PT HH Agency: Other - See comment [facility]) Date HH Agency Contacted: 03/30/24 Time HH Agency Contacted: 1315 Representative spoke with at [facility] Agency: Shelvy Dickens  Social Determinants of Health (SDOH) Interventions SDOH Screenings   Food Insecurity: No Food Insecurity (03/28/2024)  Housing: Low Risk  (03/28/2024)  Transportation Needs: No Transportation Needs (03/28/2024)  Utilities: Not At Risk (03/28/2024)  Financial Resource Strain: Medium Risk (11/20/2023)   Received from [facility]  Physical Activity: Unknown (10/06/2023)   Received from [facility]  Social Connections: Somewhat Isolated  (06/30/2023)   Received from [facility]  Stress: Stress Concern Present (03/16/2024)   Received from [facility]  Tobacco Use: High Risk (03/28/2024)   Readmission Risk Interventions     No data to display

## 2024-03-30 NOTE — Progress Notes (Addendum)
 PROGRESS NOTE    Sheryl Brown  UJW:[id number] DOB: Jan 15, 1961 DOA: 03/27/2024 PCP: Authur Leghorn, MD    Brief Narrative:  Sheryl Brown is a 63 y.o. female with medical history significant of GERD, meningitis, neuropathy, seasonal allergies, seizure disorder, history of nonhemorrhagic CVA, active tobacco abuse, gingivitis, chronic lower back pain without sciatica, osteoarthritis, adhesive capsulitis of the right shoulder, situational depression, GERD, hypertension, hyperlipidemia, obstructive sleep apnea, labyrinthitis, vertigo, UTI, vitamin D deficiency, class 2 obesity who presented to the emergency department for complaints of blurred vision, left facial and glossal numbness.  She has had some gait disturbances, this is due to left knee arthritis    Assessment and Plan:  Hypertensive crisis -Smoking cessation advised. -Weight loss advised. -resume antihypertensives Advised to to follow-up with PCP in 7 to 14 days. -needs CPAP machine   Active Problems:   History of CVA (cerebrovascular accident) And history of:   TIA (transient ischemic attack) -migraine history Resume aspirin   MRI negative -outpatinet neurology follow up     Seizure (HCC) Resume lamotrigine  150 mg p.o. daily.     TOBACCO ABUSE   Personal history of nicotine  dependence Tobacco cessation advised. Nicotine  replacement therapy ordered.     GERD Continue pantoprazole  40 mg p.o. twice daily.     HTN (hypertension) -resume BP meds    Hyperlipidemia Atorvastatin  20 mg p.o. daily.     Vitamin D deficiency Advised to supplement vitamin D regularly.     Chronic bilateral low back pain without sciatica Analgesics as needed.     Class 2 obesity Estimated body mass index is 35.4 kg/m as calculated from the following:   Height as of this encounter: 4\' 11"  (1.499 m).   Weight as of this encounter: 79.5 kg.      OSA (obstructive sleep apnea) -thinks she feels better this AM after wearing the  cPAP -TOC to arrange CPAP-- orders placed-- await insurance authorization  Constipation - Ordered Linzess x 1 as she states she has it at home and it works  Patient has multiple upcoming appointments for her chronic issues including ophthalmology, GI and neurology: Encouraged her to keep all these appointments as she needs medications/CPAP adjusted  DVT prophylaxis: enoxaparin  (LOVENOX ) injection 40 mg Start: 03/28/24 2200    Code Status: Full Code Sister on phone Disposition Plan:  Level of care: Telemetry Cardiac Status is: Observation-- await CPAP arrangement for d/c- hoping for later today     Consultants:  TOC   Subjective: Wore CPAP last night and slept better  Objective: Vitals:   03/29/24 2006 03/29/24 2300 03/30/24 0443 03/30/24 1312  BP: 130/80  125/65 (!) 130/91  Pulse: 88 84 74 88  Resp: 18 19 18 18   Temp: 98.9 F (37.2 C)  98 F (36.7 C) 97.6 F (36.4 C)  TempSrc: Oral  Oral Oral  SpO2: 95% 95% 100% 100%  Weight:      Height:        Intake/Output Summary (Last 24 hours) at 03/30/2024 1425 Last data filed at 03/30/2024 0502 Gross per 24 hour  Intake 720 ml  Output --  Net 720 ml   Filed Weights   03/28/24 0612  Weight: 79.5 kg    Examination:    General: Appearance:    Obese female in no acute distress     Lungs:     respirations unlabored  Heart:    Normal heart rate. Normal rhythm. No murmurs, rubs, or gallops.   MS:   All  extremities are intact.   Neurologic:   Awake, alert      Data Reviewed: I have personally reviewed following labs and imaging studies  CBC: Recent Labs  Lab 03/27/24 1942 03/29/24 0434  WBC 7.9 6.6  NEUTROABS 3.2  --   HGB 12.0 11.4*  HCT 37.0 35.9*  MCV 91.6 95.2  PLT 236 211   Basic Metabolic Panel: Recent Labs  Lab 03/27/24 2050 03/29/24 0434  NA 140 138  K 3.8 4.2  CL 106 105  CO2 25 23  GLUCOSE 87 92  BUN 16 20  CREATININE 0.85 0.96  CALCIUM  9.3 9.0   GFR: Estimated Creatinine  Clearance: 54.6 mL/min (by C-G formula based on SCr of 0.96 mg/dL). Liver Function Tests: Recent Labs  Lab 03/27/24 2050 03/29/24 0434  AST 22 16  ALT 22 21  ALKPHOS 83 64  BILITOT 0.2 0.6  PROT 6.6 6.5  ALBUMIN 3.7 3.3*   No results for input(s): "LIPASE", "AMYLASE" in the last 168 hours. No results for input(s): "AMMONIA" in the last 168 hours. Coagulation Profile: No results for input(s): "INR", "PROTIME" in the last 168 hours. Cardiac Enzymes: No results for input(s): "CKTOTAL", "CKMB", "CKMBINDEX", "TROPONINI" in the last 168 hours. BNP (last 3 results) No results for input(s): "PROBNP" in the last 8760 hours. HbA1C: No results for input(s): "HGBA1C" in the last 72 hours. CBG: No results for input(s): "GLUCAP" in the last 168 hours. Lipid Profile: No results for input(s): "CHOL", "HDL", "LDLCALC", "TRIG", "CHOLHDL", "LDLDIRECT" in the last 72 hours. Thyroid Function Tests: No results for input(s): "TSH", "T4TOTAL", "FREET4", "T3FREE", "THYROIDAB" in the last 72 hours. Anemia Panel: No results for input(s): "VITAMINB12", "FOLATE", "FERRITIN", "TIBC", "IRON", "RETICCTPCT" in the last 72 hours. Sepsis Labs: No results for input(s): "PROCALCITON", "LATICACIDVEN" in the last 168 hours.  No results found for this or any previous visit (from the past 240 hours).       Radiology Studies: MR BRAIN WO CONTRAST Result Date: 03/28/2024 CLINICAL DATA:  Neuro deficit, acute, stroke suspected EXAM: MRI HEAD WITHOUT CONTRAST TECHNIQUE: Multiplanar, multiecho pulse sequences of the brain and surrounding structures were obtained without intravenous contrast. COMPARISON:  CTA head neck Mar 27, 2024.  MRI head 06/08/2021. FINDINGS: Limited study due to artifact.  Within this limitation: Brain: No acute infarction, hemorrhage, hydrocephalus, extra-axial collection or mass lesion. Vascular: Normal flow voids. Skull and upper cervical spine: Normal marrow signal. Sinuses/Orbits: Clear  sinuses.  No acute orbital findings. IMPRESSION: No evidence of acute intracranial abnormality. Electronically Signed   By: Stevenson Elbe M.D.   On: 03/28/2024 19:14        Scheduled Meds:  amLODipine   10 mg Oral Daily   aspirin  EC  81 mg Oral Daily   atorvastatin   20 mg Oral Daily   buPROPion   150 mg Oral Daily   enoxaparin  (LOVENOX ) injection  40 mg Subcutaneous Q24H   lamoTRIgine   150 mg Oral Daily   [START ON 03/31/2024] linaclotide  290 mcg Oral QAC breakfast   nicotine   7 mg Transdermal Daily   pantoprazole   40 mg Oral BID   senna-docusate  1 tablet Oral BID   Continuous Infusions:   LOS: 0 days    Time spent: 45 minutes spent on chart review, discussion with nursing staff, consultants, updating family and interview/physical exam; more than 50% of that time was spent in counseling and/or coordination of care.    Enrigue Harvard, DO [facility] Available via Epic secure chat 7am-7pm After  these hours, please refer to coverage provider listed on [email or web address] 03/30/2024, 2:25 PM

## 2024-03-31 DIAGNOSIS — I169 Hypertensive crisis, unspecified: Secondary | ICD-10-CM | POA: Diagnosis not present

## 2024-03-31 LAB — TSH: TSH: 1.581 u[IU]/mL (ref 0.350–4.500)

## 2024-03-31 MED ORDER — ASPIRIN 81 MG PO TBEC: 81.0000 mg | DELAYED_RELEASE_TABLET | Freq: Every day | ORAL | 0 refills | Status: AC

## 2024-03-31 MED ORDER — HYDRALAZINE HCL 20 MG/ML IJ SOLN: 10.0000 mg | INTRAMUSCULAR | Status: DC | PRN

## 2024-03-31 MED ORDER — MECLIZINE HCL 25 MG PO TABS: 25.0000 mg | ORAL_TABLET | Freq: Three times a day (TID) | ORAL | 0 refills | Status: DC | PRN

## 2024-03-31 MED ORDER — GUAIFENESIN 100 MG/5ML PO LIQD: 5.0000 mL | ORAL | Status: DC | PRN

## 2024-03-31 MED ORDER — NICOTINE 7 MG/24HR TD PT24: 7.0000 mg | MEDICATED_PATCH | Freq: Every day | TRANSDERMAL | 0 refills | Status: DC

## 2024-03-31 MED ORDER — SENNOSIDES-DOCUSATE SODIUM 8.6-50 MG PO TABS: 1.0000 | ORAL_TABLET | Freq: Every evening | ORAL | Status: DC | PRN

## 2024-03-31 MED ORDER — IPRATROPIUM-ALBUTEROL 0.5-2.5 (3) MG/3ML IN SOLN: 3.0000 mL | RESPIRATORY_TRACT | Status: DC | PRN

## 2024-03-31 MED ORDER — METOPROLOL TARTRATE 5 MG/5ML IV SOLN: 5.0000 mg | INTRAVENOUS | Status: DC | PRN

## 2024-03-31 NOTE — TOC Transition Note (Signed)
 Transition of Care [facility]) - Discharge Note  Patient Details  Name: Sheryl Brown MRN: [id number] Date of Birth: [birth year]  Transition [facility]) CM/SW Contact:  Zenon Hilda, LCSW Phone Number: 03/31/2024, 10:48 AM  Clinical Narrative: CSW notified by [facility] with [employer] that CPAP was approved. [employer] to contact patient to do a home set up for CPAP. CSW notified Shelvy Dickens with [facility] of discharge. TOC signing off.  Final next level of care: Home w Home Health Services Barriers to Discharge: Barriers Resolved  Patient Goals and CMS Choice Patient states their goals for this hospitalization and ongoing recovery are:: return home CMS Medicare.gov Compare Post Acute Care list provided to:: Patient Choice offered to / list presented to : Patient  Discharge Plan and Services Additional resources added to the After Visit Summary for   In-house Referral: Clinical Social Work    DME Arranged: Continuous positive airway pressure (CPAP), [facility] [facility] DME Agency: [employer] Date DME Agency Contacted: 03/30/24 Time DME Agency Contacted: 1308 Representative spoke with at DME Agency: [facility] HH Arranged: PT HH Agency: Other - See comment [facility]) Date HH Agency Contacted: 03/30/24 Time HH Agency Contacted: 1315 Representative spoke with at [facility] Agency: Shelvy Dickens  Social Drivers of Health (SDOH) Interventions SDOH Screenings   Food Insecurity: No Food Insecurity (03/28/2024)  Housing: Low Risk  (03/28/2024)  Transportation Needs: No Transportation Needs (03/28/2024)  Utilities: Not At Risk (03/28/2024)  Financial Resource Strain: Medium Risk (11/20/2023)   Received from [facility]  Physical Activity: Unknown (10/06/2023)   Received from [facility]  Social Connections: Somewhat Isolated (06/30/2023)   [facility] from [facility]  Stress: Stress Concern Present (03/16/2024)   Received from [facility]  Tobacco Use: High Risk (03/28/2024)   Readmission Risk Interventions     No data to  display

## 2024-03-31 NOTE — Hospital Course (Addendum)
 Brief Narrative:   63 y.o. female with medical history significant of GERD, meningitis, neuropathy, seasonal allergies, seizure disorder, history of nonhemorrhagic CVA, active tobacco abuse, gingivitis, chronic lower back pain without sciatica, osteoarthritis, adhesive capsulitis of the right shoulder, situational depression, GERD, hypertension, hyperlipidemia, obstructive sleep apnea, labyrinthitis, vertigo, UTI, vitamin D deficiency, class 2 obesity who presented to the emergency department for complaints of blurred vision, left facial and glossal numbness.  She has had some gait disturbances, this is due to left knee arthritis.  Upon admission MRI brain is negative, blood pressure slowly improving. Today she is doing significantly better, home CPAP has been arranged.  Confirm she has medications at home.  Assessment & Plan:  Principal Problem:   Hypertensive crisis Active Problems:   TOBACCO ABUSE   GERD   HTN (hypertension)   Hyperlipidemia   Seizure (HCC)   TIA (transient ischemic attack)   Vitamin D deficiency   Chronic bilateral low back pain without sciatica   History of CVA (cerebrovascular accident)   Personal history of nicotine  dependence   Class 2 obesity   OSA (obstructive sleep apnea)   Hypertensive crisis -Hypertensive emergency/crisis has resolved.  Blood pressure is in better range now.  Advised medication compliance.  Current medications Norvasc  10 mg daily.      History of CVA (cerebrovascular accident)   TIA (transient ischemic attack) -migraine history -Currently on aspirin  and statin.  MRI brain is negative.     Seizure (HCC) Resume lamotrigine  150 mg p.o. daily.     TOBACCO ABUSE   Personal history of nicotine  dependence Tobacco cessation advised. Nicotine  replacement therapy      GERD Continue pantoprazole  40 mg p.o. twice daily.     HTN (hypertension) -resume BP meds     Hyperlipidemia Atorvastatin  20 mg p.o. daily.     Vitamin D  deficiency Advised to supplement vitamin D regularly.     Chronic bilateral low back pain without sciatica Analgesics as needed.     Class 2 obesity Estimated body mass index is 35.4 kg/m as calculated from the following:   Height as of this encounter: 4\' 11"  (1.499 m).   Weight as of this encounter: 79.5 kg.      OSA (obstructive sleep apnea) -thinks she feels better this AM after wearing the cPAP -TOC to arrange CPAP-- orders placed-- await insurance authorization   Constipation - As needed bowel regimen  PT/OT-home health.   Patient has multiple upcoming appointments for her chronic issues including ophthalmology, GI and neurology: Encouraged her to keep all these appointments as she needs medications/CPAP adjusted  He really needs to quit smoking and remain compliant with his medications.  DVT prophylaxis: enoxaparin  (LOVENOX ) injection 40 mg Start: 03/28/24 2200      Code Status: Full Code Family Communication:       Subjective: Seen at bedside, feeling well.  CPAP has been arranged   Examination:  General exam: Appears calm and comfortable  Respiratory system: Clear to auscultation. Respiratory effort normal. Cardiovascular system: S1 & S2 heard, RRR. No JVD, murmurs, rubs, gallops or clicks. No pedal edema. Gastrointestinal system: Abdomen is nondistended, soft and nontender. No organomegaly or masses felt. Normal bowel sounds heard. Central nervous system: Alert and oriented. No focal neurological deficits. Extremities: Symmetric 5 x 5 power. Skin: No rashes, lesions or ulcers Psychiatry: Judgement and insight appear normal. Mood & affect appropriate.

## 2024-03-31 NOTE — Progress Notes (Signed)
   03/31/24 0000  BiPAP/CPAP/SIPAP  BiPAP/CPAP/SIPAP Pt Type Adult  BiPAP/CPAP/SIPAP Resmed  Mask Type Full face mask  Dentures removed? Not applicable  Mask Size Large  FiO2 (%) 21 %  Patient Home Machine No  Patient Home Mask No  Patient Home Tubing No  Auto Titrate Yes  Minimum cmH2O 4 cmH2O  Maximum cmH2O 15 cmH2O  CPAP/SIPAP surface wiped down Yes

## 2024-03-31 NOTE — Discharge Summary (Signed)
 Physician Discharge Summary  Sheryl Brown WUJ:[id number] DOB: 01/03/1961 DOA: 03/27/2024  PCP: Authur Leghorn, MD  Admit date: 03/27/2024 Discharge date: 03/31/2024  Admitted From: Home Disposition: Home  Recommendations for Outpatient Follow-up:  Follow up with PCP in 1-2 weeks Please obtain BMP/CBC in one week your next doctors visit.  CPAP arranged  Discharge Condition: Stable CODE STATUS: Full code Diet recommendation: Regular  Brief/Interim Summary: Brief Narrative:   63 y.o. female with medical history significant of GERD, meningitis, neuropathy, seasonal allergies, seizure disorder, history of nonhemorrhagic CVA, active tobacco abuse, gingivitis, chronic lower back pain without sciatica, osteoarthritis, adhesive capsulitis of the right shoulder, situational depression, GERD, hypertension, hyperlipidemia, obstructive sleep apnea, labyrinthitis, vertigo, UTI, vitamin D deficiency, class 2 obesity who presented to the emergency department for complaints of blurred vision, left facial and glossal numbness.  She has had some gait disturbances, this is due to left knee arthritis.  Upon admission MRI brain is negative, blood pressure slowly improving. Today she is doing significantly better, home CPAP has been arranged.  Confirm she has medications at home.  Assessment & Plan:  Principal Problem:   Hypertensive crisis Active Problems:   TOBACCO ABUSE   GERD   HTN (hypertension)   Hyperlipidemia   Seizure (HCC)   TIA (transient ischemic attack)   Vitamin D deficiency   Chronic bilateral low back pain without sciatica   History of CVA (cerebrovascular accident)   Personal history of nicotine  dependence   Class 2 obesity   OSA (obstructive sleep apnea)   Hypertensive crisis -Hypertensive emergency/crisis has resolved.  Blood pressure is in better range now.  Advised medication compliance.  Current medications Norvasc  10 mg daily.      History of CVA (cerebrovascular  accident)   TIA (transient ischemic attack) -migraine history -Currently on aspirin  and statin.  MRI brain is negative.     Seizure (HCC) Resume lamotrigine  150 mg p.o. daily.     TOBACCO ABUSE   Personal history of nicotine  dependence Tobacco cessation advised. Nicotine  replacement therapy      GERD Continue pantoprazole  40 mg p.o. twice daily.     HTN (hypertension) -resume BP meds     Hyperlipidemia Atorvastatin  20 mg p.o. daily.     Vitamin D deficiency Advised to supplement vitamin D regularly.     Chronic bilateral low back pain without sciatica Analgesics as needed.     Class 2 obesity Estimated body mass index is 35.4 kg/m as calculated from the following:   Height as of this encounter: 4\' 11"  (1.499 m).   Weight as of this encounter: 79.5 kg.      OSA (obstructive sleep apnea) -thinks she feels better this AM after wearing the cPAP -TOC to arrange CPAP-- orders placed-- await insurance authorization   Constipation - As needed bowel regimen  PT/OT-home health.   Patient has multiple upcoming appointments for her chronic issues including ophthalmology, GI and neurology: Encouraged her to keep all these appointments as she needs medications/CPAP adjusted  He really needs to quit smoking and remain compliant with his medications.  DVT prophylaxis: enoxaparin  (LOVENOX ) injection 40 mg Start: 03/28/24 2200      Code Status: Full Code Family Communication:       Subjective: Seen at bedside, feeling well.  CPAP has been arranged   Examination:  General exam: Appears calm and comfortable  Respiratory system: Clear to auscultation. Respiratory effort normal. Cardiovascular system: S1 & S2 heard, RRR. No JVD, murmurs, rubs, gallops or clicks.  No pedal edema. Gastrointestinal system: Abdomen is nondistended, soft and nontender. No organomegaly or masses felt. Normal bowel sounds heard. Central nervous system: Alert and oriented. No focal neurological  deficits. Extremities: Symmetric 5 x 5 power. Skin: No rashes, lesions or ulcers Psychiatry: Judgement and insight appear normal. Mood & affect appropriate.    Discharge Diagnoses:  Principal Problem:   Hypertensive crisis Active Problems:   TOBACCO ABUSE   GERD   HTN (hypertension)   Hyperlipidemia   Seizure (HCC)   TIA (transient ischemic attack)   Vitamin D deficiency   Chronic bilateral low back pain without sciatica   History of CVA (cerebrovascular accident)   Personal history of nicotine  dependence   Class 2 obesity   OSA (obstructive sleep apnea)      Discharge Exam: Vitals:   03/30/24 2210 03/31/24 0642  BP: 130/80 102/72  Pulse: 88 87  Resp: 18 20  Temp: 98.8 F (37.1 C) 98.4 F (36.9 C)  SpO2: 95% 100%   Vitals:   03/30/24 0443 03/30/24 1312 03/30/24 2210 03/31/24 0642  BP: 125/65 (!) 130/91 130/80 102/72  Pulse: 74 88 88 87  Resp: 18 18 18 20   Temp: 98 F (36.7 C) 97.6 F (36.4 C) 98.8 F (37.1 C) 98.4 F (36.9 C)  TempSrc: Oral Oral Tympanic Oral  SpO2: 100% 100% 95% 100%  Weight:      Height:          Discharge Instructions   Allergies as of 03/31/2024       Reactions   Gabapentin     Pt states that she isn't allergic and continues to take medication. Other reaction(s): Other Fatigue and feeling "zombie like" and no help with nerves.         Medication List     PAUSE taking these medications    amitriptyline  50 MG tablet Wait to take this until your doctor or other care provider tells you to start again. Commonly known as: ELAVIL  Take 50 mg by mouth at bedtime.   fluconazole  150 MG tablet Wait to take this until your doctor or other care provider tells you to start again. Commonly known as: Diflucan  Take 1 tablet (150 mg total) by mouth daily.   PARoxetine 20 MG tablet Wait to take this until your doctor or other care provider tells you to start again. Commonly known as: PAXIL Take 20 mg by mouth daily.        STOP taking these medications    atorvastatin  20 MG tablet Commonly known as: LIPITOR   naproxen  375 MG tablet Commonly known as: NAPROSYN        TAKE these medications    amLODipine  10 MG tablet Commonly known as: NORVASC  Take 1 tablet (10 mg total) by mouth daily.   aspirin  EC 81 MG tablet Take 1 tablet (81 mg total) by mouth daily. Swallow whole. Start taking on: Apr 01, 2024   baclofen  10 MG tablet Commonly known as: LIORESAL  Take 1 tablet (10 mg total) by mouth 3 (three) times daily.   buPROPion  150 MG 24 hr tablet Commonly known as: WELLBUTRIN  XL Take 150 mg by mouth daily.   Cholecalciferol 50 MCG (2000 UT) Tabs Take 2 tablets by mouth daily.   gabapentin  100 MG capsule Commonly known as: NEURONTIN  TAKE 1 CAPSULE(100 MG) BY MOUTH AT BEDTIME   hyoscyamine 0.375 MG 12 hr tablet Commonly known as: LEVBID Take 375 mcg by mouth 2 (two) times daily.   lamoTRIgine  150 MG tablet Commonly known  as: LAMICTAL  Take 1 tablet (150 mg total) by mouth daily.   meclizine  25 MG tablet Commonly known as: ANTIVERT  Take 1 tablet (25 mg total) by mouth 3 (three) times daily as needed for dizziness.   nicotine  7 mg/24hr patch Commonly known as: NICODERM CQ  - dosed in mg/24 hr Place 1 patch (7 mg total) onto the skin daily.   ondansetron  4 MG disintegrating tablet Commonly known as: ZOFRAN -ODT Take 1 tablet (4 mg total) by mouth every 8 (eight) hours as needed for nausea or vomiting.   pantoprazole  40 MG tablet Commonly known as: Protonix  Take 1 tablet (40 mg total) by mouth 2 (two) times daily.   rosuvastatin 20 MG tablet Commonly known as: CRESTOR Take 20 mg by mouth at bedtime.   SUMAtriptan  50 MG tablet Commonly known as: Imitrex  Take 1 tablet (50 mg total) by mouth every 2 (two) hours as needed. May repeat in 2 hours if headache persists or recurs.               Durable Medical Equipment  (From admission, onward)           Start     Ordered    03/30/24 1240  For home use only DME [facility]  Once       Question:  Patient needs a [place] to treat with the following condition  Answer:  Weakness   03/30/24 1239   03/29/24 1211  For home use only DME continuous positive airway pressure (CPAP)  Once       Comments: Auto max15 min4  4 mins MG Zenon Hilda, LCSW  Question Answer Comment  Length of Need Lifetime   Patient has OSA or probable OSA Yes   Is the patient currently using CPAP in the home No   If no (to question two) date of sleep study 2/25   Date of face to face encounter 5/19   Settings Other see comments   Signs and symptoms of probable OSA  (select all that apply) Snoring   Signs and symptoms of probable OSA  (select all that apply) Moring headaches   CPAP supplies needed Mask, headgear, cushions, filters, heated tubing and water  chamber      03/29/24 1210            Follow-up Information     [facility] Follow up.   Why: [facility] will provide PT in the home after discharge.        Authur Leghorn, MD Follow up in 1 week(s).   Specialty: Family Medicine Contact information: [address] [place] [address] [phone]                Allergies  Allergen Reactions   Gabapentin      Pt states that she isn't allergic and continues to take medication. Other reaction(s): Other Fatigue and feeling "zombie like" and no help with nerves.     You were cared for by a hospitalist during your hospital stay. If you have any questions about your discharge medications or the care you received while you were in the hospital after you are discharged, you can call the unit and asked to speak with the hospitalist on call if the hospitalist that took care of you is not available. Once you are discharged, your primary care physician will handle any further medical issues. Please note that no refills for any discharge medications will be authorized once you are discharged, as it is  imperative that you return to your primary care physician (or establish a relationship with a primary care physician if you do not have one) for your aftercare needs so that they can reassess your need for medications and monitor your lab values.  You were cared for by a hospitalist during your hospital stay. If you have any questions about your discharge medications or the care you received while you were in the hospital after you are discharged, you can call the unit and asked to speak with the hospitalist on call if the hospitalist that took care of you is not available. Once you are discharged, your primary care physician will handle any further medical issues. Please note that NO REFILLS for any discharge medications will be authorized once you are discharged, as it is imperative that you return to your primary care physician (or establish a relationship with a primary care physician if you do not have one) for your aftercare needs so that they can reassess your need for medications and monitor your lab values.  Please request your Prim.MD to go over all Hospital Tests and Procedure/Radiological results at the follow up, please get all Hospital records sent to your Prim MD by signing hospital release before you go home.  Get CBC, CMP, 2 view Chest X ray checked  by Primary MD during your next visit or SNF MD in 5-7 days ( we routinely change or add medications that can affect your baseline labs and fluid status, therefore we recommend that you get the mentioned basic workup next visit with your PCP, your PCP may decide not to get them or add new tests based on their clinical decision)  On your next visit with your primary care physician please Get Medicines reviewed and adjusted.  If you experience worsening of your admission symptoms, develop shortness of breath, life threatening emergency, suicidal or homicidal thoughts you must seek medical attention immediately by calling 911 or calling your MD  immediately  if symptoms less severe.  You Must read complete instructions/literature along with all the possible adverse reactions/side effects for all the Medicines you take and that have been prescribed to you. Take any new Medicines after you have completely understood and accpet all the possible adverse reactions/side effects.   Do not drive, operate heavy machinery, perform activities at heights, swimming or participation in water  activities or provide baby sitting services if your were admitted for syncope or siezures until you have seen by Primary MD or a Neurologist and advised to do so again.  Do not drive when taking Pain medications.   Procedures/Studies: MR BRAIN WO CONTRAST Result Date: 03/28/2024 CLINICAL DATA:  Neuro deficit, acute, stroke suspected EXAM: MRI HEAD WITHOUT CONTRAST TECHNIQUE: Multiplanar, multiecho pulse sequences of the brain and surrounding structures were obtained without intravenous contrast. COMPARISON:  CTA head neck Mar 27, 2024.  MRI head 06/08/2021. FINDINGS: Limited study due to artifact.  Within this limitation: Brain: No acute infarction, hemorrhage, hydrocephalus, extra-axial collection or mass lesion. Vascular: Normal flow voids. Skull and upper cervical spine: Normal marrow signal. Sinuses/Orbits: Clear sinuses.  No acute orbital findings. IMPRESSION: No evidence of acute intracranial abnormality. Electronically Signed   By: Stevenson Elbe M.D.   On: 03/28/2024 19:14   CT ANGIO HEAD NECK W WO CM Result Date: 03/27/2024 CLINICAL DATA:  Syncope/presyncope, cerebrovascular cause suspected EXAM: CT ANGIOGRAPHY HEAD AND NECK WITH AND WITHOUT CONTRAST TECHNIQUE: Multidetector CT imaging of the head and neck was performed using the standard protocol during bolus  administration of intravenous contrast. Multiplanar CT image reconstructions and MIPs were obtained to evaluate the vascular anatomy. Carotid stenosis measurements (when applicable) are obtained  utilizing NASCET criteria, using the distal internal carotid diameter as the denominator. RADIATION DOSE REDUCTION: This exam was performed according to the departmental dose-optimization program which includes automated exposure control, adjustment of the mA and/or kV according to patient size and/or use of iterative reconstruction technique. CONTRAST:  75mL OMNIPAQUE  IOHEXOL  350 MG/ML SOLN COMPARISON:  None Available. FINDINGS: CT HEAD FINDINGS Brain: No evidence of acute infarction, hemorrhage, hydrocephalus, extra-axial collection or mass lesion/mass effect. Vascular: See below. Skull: No acute fracture. Sinuses/Orbits: Clear sinuses.  No acute orbital findings. Review of the MIP images confirms the above findings CTA NECK FINDINGS Aortic arch: Great vessel origins are patent. Right carotid system: Atherosclerosis of the carotid bifurcation with approximately 50% stenosis of the IC artery Left carotid system: No evidence of dissection, stenosis (50% or greater), or occlusion. Vertebral arteries: Codominant. No evidence of dissection, stenosis (50% or greater), or occlusion. Skeleton: No acute abnormality on limited assessment. Other neck: No acute abnormality on limited assessment. Upper chest: Visualized lung apices are clear. Review of the MIP images confirms the above findings CTA HEAD FINDINGS Anterior circulation: Bilateral intracranial ICAs, MCAs, and ACAs are patent without proximal hemodynamically significant stenosis. No aneurysm identified. Posterior circulation: Bilateral intradural vertebral arteries, basilar artery and bilateral posterior arch are patent without proximal hemodynamically significant stenosis. Right fetal type PCA and small vertebrobasilar system, anatomic variant. Venous sinuses: As permitted by contrast timing, patent. Anatomic variants: Detailed above. Review of the MIP images confirms the above findings IMPRESSION: 1. No large vessel occlusion. 2. Approximately 50% stenosis of the  right ICA origin. Electronically Signed   By: Stevenson Elbe M.D.   On: 03/27/2024 20:38     The results of significant diagnostics from this hospitalization (including imaging, microbiology, ancillary and laboratory) are listed below for reference.     Microbiology: No results found for this or any previous visit (from the past 240 hours).   Labs: BNP (last 3 results) Recent Labs    12/24/23 1535  BNP 51.6   Basic Metabolic Panel: Recent Labs  Lab 03/27/24 2050 03/29/24 0434  NA 140 138  K 3.8 4.2  CL 106 105  CO2 25 23  GLUCOSE 87 92  BUN 16 20  CREATININE 0.85 0.96  CALCIUM  9.3 9.0   Liver Function Tests: Recent Labs  Lab 03/27/24 2050 03/29/24 0434  AST 22 16  ALT 22 21  ALKPHOS 83 64  BILITOT 0.2 0.6  PROT 6.6 6.5  ALBUMIN 3.7 3.3*   No results for input(s): "LIPASE", "AMYLASE" in the last 168 hours. No results for input(s): "AMMONIA" in the last 168 hours. CBC: Recent Labs  Lab 03/27/24 1942 03/29/24 0434  WBC 7.9 6.6  NEUTROABS 3.2  --   HGB 12.0 11.4*  HCT 37.0 35.9*  MCV 91.6 95.2  PLT 236 211   Cardiac Enzymes: No results for input(s): "CKTOTAL", "CKMB", "CKMBINDEX", "TROPONINI" in the last 168 hours. BNP: Invalid input(s): "POCBNP" CBG: No results for input(s): "GLUCAP" in the last 168 hours. D-Dimer No results for input(s): "DDIMER" in the last 72 hours. Hgb A1c No results for input(s): "HGBA1C" in the last 72 hours. Lipid Profile No results for input(s): "CHOL", "HDL", "LDLCALC", "TRIG", "CHOLHDL", "LDLDIRECT" in the last 72 hours. Thyroid function studies Recent Labs    03/31/24 0936  TSH 1.581   Anemia work up No results  for input(s): "VITAMINB12", "FOLATE", "FERRITIN", "TIBC", "IRON", "RETICCTPCT" in the last 72 hours. Urinalysis    Component Value Date/Time   COLORURINE YELLOW 11/26/2021 1227   APPEARANCEUR CLEAR 11/26/2021 1227   LABSPEC 1.014 11/26/2021 1227   PHURINE 5.0 11/26/2021 1227   GLUCOSEU NEGATIVE  11/26/2021 1227   HGBUR NEGATIVE 11/26/2021 1227   HGBUR negative 03/14/2009 1404   BILIRUBINUR NEGATIVE 11/26/2021 1227   KETONESUR NEGATIVE 11/26/2021 1227   PROTEINUR NEGATIVE 11/26/2021 1227   UROBILINOGEN 1.0 03/14/2009 1404   NITRITE NEGATIVE 11/26/2021 1227   LEUKOCYTESUR NEGATIVE 11/26/2021 1227   Sepsis Labs Recent Labs  Lab 03/27/24 1942 03/29/24 0434  WBC 7.9 6.6   Microbiology No results found for this or any previous visit (from the past 240 hours).   Time coordinating discharge:  I have spent 35 minutes face to face with the patient and on the ward discussing the patients care, assessment, plan and disposition with other care givers. >50% of the time was devoted counseling the patient about the risks and benefits of treatment/Discharge disposition and coordinating care.   SIGNED:   Maggie Schooner, MD  [facility] 03/31/2024, 12:20 PM   If 7PM-7AM, please contact night-coverage

## 2024-04-29 ENCOUNTER — Ambulatory Visit: Admitting: Dermatology

## 2024-04-29 ENCOUNTER — Encounter: Payer: Self-pay | Admitting: Dermatology

## 2024-04-29 DIAGNOSIS — L720 Epidermal cyst: Secondary | ICD-10-CM

## 2024-04-29 DIAGNOSIS — I160 Hypertensive urgency: Secondary | ICD-10-CM | POA: Diagnosis not present

## 2024-04-29 DIAGNOSIS — R2232 Localized swelling, mass and lump, left upper limb: Secondary | ICD-10-CM

## 2024-04-29 DIAGNOSIS — D485 Neoplasm of uncertain behavior of skin: Secondary | ICD-10-CM

## 2024-04-29 NOTE — Progress Notes (Signed)
   Follow-Up Visit   Subjective  Sheryl Brown is a 63 y.o. female who presents for the following: Concern for cyst on the shoulder that is tender and drains. Patient is accompanied by granddaughter.  Patient has a history of seizures but the last time she had a seizure was in the early 2000s. She recently was seen in the Emergency Room for a Hypertensive Crisis. She was prescribed amlodipine  and advised to follow up with her Primary Care Provider. She has not scheduled the follow up with her PCP at this time.  The following portions of the chart were reviewed this encounter and updated as appropriate: medications, allergies, medical history  Review of Systems:  No other skin or systemic complaints except as noted in HPI or Assessment and Plan.  Objective  Well appearing patient in no apparent distress; mood and affect are within normal limits.   A focused examination was performed of the following areas: Right shoulder  Relevant exam findings are noted in the Assessment and Plan.  Right Upper Back Soft subcutaneous tissue  Assessment & Plan   EPIDERMAL INCLUSION CYST Exam: Subcutaneous nodule at left shoulder  Benign-appearing. Exam most consistent with an epidermal inclusion cyst. Discussed that a cyst is a benign growth that can grow over time and sometimes get irritated or inflamed. Recommend observation if it is not bothersome. Discussed option of surgical excision to remove it if it is growing, symptomatic, or other changes noted. Please call for new or changing lesions so they can be evaluated.  Hypertensive Urgency Discussed that with recent history of uncontrolled hypertension; we would not proceed with a benign excision until she was cleared by her PCP  NEOPLASM OF UNCERTAIN BEHAVIOR OF SKIN Right Upper Back Excision for suspected subcutaneous cyst. Patient will need to see her PCP before scheduling and be cleared for surgery due to recent hypertensive crisis.    Return for PT will call to schedule excision for cyst on the shoulder after receiving clearance from her PCP.  Maurine Sovereign, RN, am acting as scribe for Deneise Finlay, MD .   Documentation: I have reviewed the above documentation for accuracy and completeness, and I agree with the above.  Deneise Finlay, MD

## 2024-05-03 ENCOUNTER — Ambulatory Visit: Admitting: Physical Therapy

## 2024-05-10 ENCOUNTER — Ambulatory Visit: Payer: 59 | Admitting: Neurology

## 2024-05-10 ENCOUNTER — Encounter: Payer: Self-pay | Admitting: Neurology

## 2024-06-23 ENCOUNTER — Institutional Professional Consult (permissible substitution): Admitting: Plastic Surgery

## 2024-06-23 ENCOUNTER — Telehealth: Payer: Self-pay | Admitting: Neurology

## 2024-06-23 ENCOUNTER — Encounter: Payer: Self-pay | Admitting: Neurology

## 2024-06-23 ENCOUNTER — Ambulatory Visit (INDEPENDENT_AMBULATORY_CARE_PROVIDER_SITE_OTHER): Admitting: Neurology

## 2024-06-23 VITALS — BP 137/83 | HR 77 | Resp 15 | Ht 60.0 in

## 2024-06-23 DIAGNOSIS — R519 Headache, unspecified: Secondary | ICD-10-CM

## 2024-06-23 DIAGNOSIS — M542 Cervicalgia: Secondary | ICD-10-CM | POA: Diagnosis not present

## 2024-06-23 DIAGNOSIS — Z87898 Personal history of other specified conditions: Secondary | ICD-10-CM

## 2024-06-23 DIAGNOSIS — R41840 Attention and concentration deficit: Secondary | ICD-10-CM

## 2024-06-23 MED ORDER — LAMOTRIGINE 150 MG PO TABS: 150.0000 mg | ORAL_TABLET | Freq: Every day | ORAL | 3 refills | Status: DC

## 2024-06-23 MED ORDER — AMITRIPTYLINE HCL 100 MG PO TABS
100.0000 mg | ORAL_TABLET | Freq: Every day | ORAL | 3 refills | Status: DC
Start: 2024-06-23 — End: 2024-08-29

## 2024-06-23 MED ORDER — SUMATRIPTAN SUCCINATE 50 MG PO TABS: 50.0000 mg | ORAL_TABLET | ORAL | 6 refills | Status: AC | PRN

## 2024-06-23 NOTE — Telephone Encounter (Signed)
Referral for physical therapy fax to [facility].Phone: [phone], Fax: [phone]

## 2024-06-23 NOTE — Patient Instructions (Addendum)
 Increase amitriptyline  to 100 mg nightly Discontinue gabapentin  since it did not help with the headaches Continue your other medications Continue use of the CPAP machine nightly Referral to PT for headache and cervicalgia Routine EEG for episode of staring spells Continue follow-up PCP Continue with therapy since it was helpful in the past. Return in 6 months or sooner if worse.
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
## 2024-06-24 ENCOUNTER — Ambulatory Visit: Payer: Self-pay | Admitting: Neurology

## 2024-06-24 LAB — HEMOGLOBIN A1C
Est. average glucose Bld gHb Est-mCnc: 100 mg/dL
Hgb A1c MFr Bld: 5.1 % (ref 4.8–5.6)

## 2024-06-24 LAB — VITAMIN B12: Vitamin B-12: 750 pg/mL (ref 232–1245)

## 2024-06-24 LAB — MAGNESIUM: Magnesium: 2.2 mg/dL (ref 1.6–2.3)

## 2024-07-06 ENCOUNTER — Ambulatory Visit (INDEPENDENT_AMBULATORY_CARE_PROVIDER_SITE_OTHER): Admitting: Neurology

## 2024-07-06 DIAGNOSIS — R519 Headache, unspecified: Secondary | ICD-10-CM

## 2024-07-06 DIAGNOSIS — R569 Unspecified convulsions: Secondary | ICD-10-CM

## 2024-07-06 DIAGNOSIS — Z87898 Personal history of other specified conditions: Secondary | ICD-10-CM

## 2024-07-06 NOTE — Procedures (Signed)
    History:  63 year old woman with history of seizure   EEG classification: Awake and drowsy  Duration: 28 minutes   Technical aspects: This EEG study was done with scalp electrodes positioned according to the 10-20 International system of electrode placement. Electrical activity was reviewed with band pass filter of 1-70Hz , sensitivity of 7 uV/mm, display speed of 43mm/sec with a 60Hz  notched filter applied as appropriate. EEG data were recorded continuously and digitally stored.   Description of the recording: The background rhythms of this recording consists of a fairly well modulated low amplitude with no clear PDR. Present in the anterior head region is a 15-20 Hz beta activity. Photic stimulation was performed, did not show any abnormalities. Hyperventilation was also performed, did not show any abnormalities. Drowsiness was manifested by background fragmentation. No abnormal epileptiform discharges seen during this recording. There was no focal slowing. There were no electrographic seizure identified.   Abnormality: None   Impression: This is essentially a normal awake and drowsy EEG. No evidence of interictal epileptiform discharges. Normal EEGs, however, do not rule out epilepsy.    Lelia Jons, MD [facility]

## 2024-08-24 ENCOUNTER — Encounter (HOSPITAL_COMMUNITY): Payer: Self-pay

## 2024-08-24 ENCOUNTER — Encounter (HOSPITAL_COMMUNITY): Payer: Self-pay | Admitting: Psychiatry

## 2024-08-24 ENCOUNTER — Inpatient Hospital Stay (HOSPITAL_COMMUNITY)
Admission: AD | Admit: 2024-08-24 | Discharge: 2024-08-29 | DRG: 885 | Disposition: A | Discharge status: Home / Self Care | Source: Intra-hospital

## 2024-08-24 ENCOUNTER — Emergency Department (HOSPITAL_COMMUNITY)
Admission: EM | Admit: 2024-08-24 | Discharge: 2024-08-24 | Disposition: A | Discharge status: Other Acute Inpatient Hospital | Source: Home / Self Care | Attending: Emergency Medicine | Admitting: Emergency Medicine

## 2024-08-24 ENCOUNTER — Other Ambulatory Visit: Payer: Self-pay

## 2024-08-24 DIAGNOSIS — F17210 Nicotine dependence, cigarettes, uncomplicated: Secondary | ICD-10-CM | POA: Diagnosis present

## 2024-08-24 DIAGNOSIS — G8929 Other chronic pain: Secondary | ICD-10-CM | POA: Diagnosis present

## 2024-08-24 DIAGNOSIS — T1491XA Suicide attempt, initial encounter: Secondary | ICD-10-CM

## 2024-08-24 DIAGNOSIS — R4189 Other symptoms and signs involving cognitive functions and awareness: Secondary | ICD-10-CM | POA: Diagnosis not present

## 2024-08-24 DIAGNOSIS — Z91148 Patient's other noncompliance with medication regimen for other reason: Secondary | ICD-10-CM

## 2024-08-24 DIAGNOSIS — F411 Generalized anxiety disorder: Secondary | ICD-10-CM | POA: Insufficient documentation

## 2024-08-24 DIAGNOSIS — Z59868 Other specified financial insecurity: Secondary | ICD-10-CM

## 2024-08-24 DIAGNOSIS — F332 Major depressive disorder, recurrent severe without psychotic features: Secondary | ICD-10-CM | POA: Diagnosis present

## 2024-08-24 DIAGNOSIS — Z9151 Personal history of suicidal behavior: Secondary | ICD-10-CM | POA: Diagnosis not present

## 2024-08-24 DIAGNOSIS — T43012A Poisoning by tricyclic antidepressants, intentional self-harm, initial encounter: Secondary | ICD-10-CM

## 2024-08-24 DIAGNOSIS — Z8249 Family history of ischemic heart disease and other diseases of the circulatory system: Secondary | ICD-10-CM | POA: Diagnosis not present

## 2024-08-24 DIAGNOSIS — F1010 Alcohol abuse, uncomplicated: Secondary | ICD-10-CM | POA: Insufficient documentation

## 2024-08-24 DIAGNOSIS — Z560 Unemployment, unspecified: Secondary | ICD-10-CM

## 2024-08-24 DIAGNOSIS — E559 Vitamin D deficiency, unspecified: Secondary | ICD-10-CM | POA: Diagnosis present

## 2024-08-24 DIAGNOSIS — Z635 Disruption of family by separation and divorce: Secondary | ICD-10-CM

## 2024-08-24 DIAGNOSIS — G40909 Epilepsy, unspecified, not intractable, without status epilepticus: Secondary | ICD-10-CM | POA: Diagnosis present

## 2024-08-24 DIAGNOSIS — K219 Gastro-esophageal reflux disease without esophagitis: Secondary | ICD-10-CM | POA: Diagnosis present

## 2024-08-24 DIAGNOSIS — Y906 Blood alcohol level of 120-199 mg/100 ml: Secondary | ICD-10-CM | POA: Diagnosis present

## 2024-08-24 DIAGNOSIS — Z604 Social exclusion and rejection: Secondary | ICD-10-CM | POA: Diagnosis present

## 2024-08-24 DIAGNOSIS — Z79899 Other long term (current) drug therapy: Secondary | ICD-10-CM

## 2024-08-24 DIAGNOSIS — F329 Major depressive disorder, single episode, unspecified: Secondary | ICD-10-CM | POA: Insufficient documentation

## 2024-08-24 DIAGNOSIS — G629 Polyneuropathy, unspecified: Secondary | ICD-10-CM | POA: Diagnosis present

## 2024-08-24 DIAGNOSIS — Z62810 Personal history of physical and sexual abuse in childhood: Secondary | ICD-10-CM

## 2024-08-24 DIAGNOSIS — F1020 Alcohol dependence, uncomplicated: Secondary | ICD-10-CM | POA: Diagnosis present

## 2024-08-24 DIAGNOSIS — R45851 Suicidal ideations: Secondary | ICD-10-CM | POA: Diagnosis present

## 2024-08-24 DIAGNOSIS — Z8661 Personal history of infections of the central nervous system: Secondary | ICD-10-CM | POA: Diagnosis not present

## 2024-08-24 DIAGNOSIS — Z9152 Personal history of nonsuicidal self-harm: Secondary | ICD-10-CM

## 2024-08-24 DIAGNOSIS — I10 Essential (primary) hypertension: Secondary | ICD-10-CM | POA: Diagnosis present

## 2024-08-24 DIAGNOSIS — Z8673 Personal history of transient ischemic attack (TIA), and cerebral infarction without residual deficits: Secondary | ICD-10-CM | POA: Diagnosis not present

## 2024-08-24 DIAGNOSIS — Z638 Other specified problems related to primary support group: Secondary | ICD-10-CM | POA: Diagnosis not present

## 2024-08-24 DIAGNOSIS — F331 Major depressive disorder, recurrent, moderate: Secondary | ICD-10-CM

## 2024-08-24 LAB — COMPREHENSIVE METABOLIC PANEL WITH GFR
ALT: 35 U/L (ref 0–44)
AST: 34 U/L (ref 15–41)
Albumin: 4.3 g/dL (ref 3.5–5.0)
Alkaline Phosphatase: 79 U/L (ref 38–126)
Anion gap: 16 — ABNORMAL HIGH (ref 5–15)
BUN: 18 mg/dL (ref 8–23)
CO2: 17 mmol/L — ABNORMAL LOW (ref 22–32)
Calcium: 9.6 mg/dL (ref 8.9–10.3)
Chloride: 108 mmol/L (ref 98–111)
Creatinine, Ser: 0.83 mg/dL (ref 0.44–1.00)
GFR, Estimated: >60 mL/min (ref >60)
Glucose, Bld: 85 mg/dL (ref 70–99)
Potassium: 4.7 mmol/L (ref 3.5–5.1)
Sodium: 142 mmol/L (ref 135–145)
Total Bilirubin: 0.5 mg/dL (ref 0.0–1.2)
Total Protein: 8.1 g/dL (ref 6.5–8.1)

## 2024-08-24 LAB — CBC
HCT: 38.3 % (ref 36.0–46.0)
Hemoglobin: 12 g/dL (ref 12.0–15.0)
MCH: 30.1 pg (ref 26.0–34.0)
MCHC: 31.3 g/dL (ref 30.0–36.0)
MCV: 96 fL (ref 80.0–100.0)
Platelets: 241 K/uL (ref 150–400)
RBC: 3.99 MIL/uL (ref 3.87–5.11)
RDW: 12.9 % (ref 11.5–15.5)
WBC: 9.9 K/uL (ref 4.0–10.5)
nRBC: 0 % (ref 0.0–0.2)

## 2024-08-24 LAB — URINE DRUG SCREEN
Amphetamines: NEGATIVE
Barbiturates: NEGATIVE
Benzodiazepines: NEGATIVE
Cocaine: NEGATIVE
Fentanyl: NEGATIVE
Methadone Scn, Ur: NEGATIVE
Opiates: NEGATIVE
Tetrahydrocannabinol: NEGATIVE

## 2024-08-24 LAB — SALICYLATE LEVEL: Salicylate Lvl: <7 mg/dL — ABNORMAL LOW (ref 7.0–30.0)

## 2024-08-24 LAB — ACETAMINOPHEN LEVEL
Acetaminophen (Tylenol), Serum: <10 ug/mL — ABNORMAL LOW (ref 10–30)
Acetaminophen (Tylenol), Serum: <10 ug/mL — ABNORMAL LOW (ref 10–30)

## 2024-08-24 LAB — LIPASE, BLOOD: Lipase: 41 U/L (ref 11–51)

## 2024-08-24 LAB — ETHANOL: Alcohol, Ethyl (B): 192 mg/dL — ABNORMAL HIGH (ref <15)

## 2024-08-24 MED ORDER — LORAZEPAM 1 MG PO TABS
1.0000 mg | ORAL_TABLET | Freq: Two times a day (BID) | ORAL | Status: AC
  Administered 2024-08-26 (×2): 1 mg via ORAL
  Filled 2024-08-24 (×2): qty 1

## 2024-08-24 MED ORDER — ONDANSETRON 4 MG PO TBDP: 4.0000 mg | ORAL_TABLET | Freq: Four times a day (QID) | ORAL | Status: AC | PRN

## 2024-08-24 MED ORDER — LORAZEPAM 1 MG PO TABS
1.0000 mg | ORAL_TABLET | Freq: Three times a day (TID) | ORAL | Status: AC
  Administered 2024-08-25 (×3): 1 mg via ORAL
  Filled 2024-08-24 (×3): qty 1

## 2024-08-24 MED ORDER — LORAZEPAM 1 MG PO TABS: 1.0000 mg | ORAL_TABLET | Freq: Four times a day (QID) | ORAL | Status: DC | PRN

## 2024-08-24 MED ORDER — LORAZEPAM 1 MG PO TABS
1.0000 mg | ORAL_TABLET | Freq: Four times a day (QID) | ORAL | Status: AC
  Administered 2024-08-24: 1 mg via ORAL
  Filled 2024-08-24: qty 1

## 2024-08-24 MED ORDER — NICOTINE 14 MG/24HR TD PT24
14.0000 mg | MEDICATED_PATCH | Freq: Every day | TRANSDERMAL | Status: DC
  Administered 2024-08-24: 14 mg via TRANSDERMAL
  Filled 2024-08-24: qty 1

## 2024-08-24 MED ORDER — LORAZEPAM 1 MG PO TABS
1.0000 mg | ORAL_TABLET | Freq: Four times a day (QID) | ORAL | Status: DC
  Administered 2024-08-24: 1 mg via ORAL
  Filled 2024-08-24: qty 1

## 2024-08-24 MED ORDER — ALUM & MAG HYDROXIDE-SIMETH 200-200-20 MG/5ML PO SUSP: 30.0000 mL | ORAL | Status: DC | PRN

## 2024-08-24 MED ORDER — ACETAMINOPHEN 325 MG PO TABS: 650.0000 mg | ORAL_TABLET | Freq: Four times a day (QID) | ORAL | Status: DC | PRN

## 2024-08-24 MED ORDER — LOPERAMIDE HCL 2 MG PO CAPS: 2.0000 mg | ORAL_CAPSULE | ORAL | Status: AC | PRN

## 2024-08-24 MED ORDER — THIAMINE HCL 100 MG PO TABS
100.0000 mg | ORAL_TABLET | Freq: Once | ORAL | Status: AC
  Administered 2024-08-24: 100 mg via ORAL
  Filled 2024-08-24 (×2): qty 1

## 2024-08-24 MED ORDER — HYDROXYZINE HCL 25 MG PO TABS
25.0000 mg | ORAL_TABLET | Freq: Four times a day (QID) | ORAL | Status: AC | PRN
  Administered 2024-08-25: 25 mg via ORAL
  Filled 2024-08-24 (×2): qty 1

## 2024-08-24 MED ORDER — HYDROXYZINE HCL 25 MG PO TABS: 25.0000 mg | ORAL_TABLET | Freq: Four times a day (QID) | ORAL | Status: DC | PRN

## 2024-08-24 MED ORDER — LORAZEPAM 1 MG PO TABS: 1.0000 mg | ORAL_TABLET | Freq: Every day | ORAL | Status: DC

## 2024-08-24 MED ORDER — THIAMINE MONONITRATE 100 MG PO TABS: 100.0000 mg | ORAL_TABLET | Freq: Every day | ORAL | Status: DC

## 2024-08-24 MED ORDER — NICOTINE POLACRILEX 2 MG MT GUM: 2.0000 mg | CHEWING_GUM | OROMUCOSAL | Status: DC

## 2024-08-24 MED ORDER — LORAZEPAM 1 MG PO TABS: 1.0000 mg | ORAL_TABLET | Freq: Two times a day (BID) | ORAL | Status: DC

## 2024-08-24 MED ORDER — LORAZEPAM 1 MG PO TABS: 1.0000 mg | ORAL_TABLET | Freq: Three times a day (TID) | ORAL | Status: DC

## 2024-08-24 MED ORDER — NICOTINE 14 MG/24HR TD PT24
14.0000 mg | MEDICATED_PATCH | Freq: Every day | TRANSDERMAL | Status: DC
  Administered 2024-08-25 – 2024-08-28 (×4): 14 mg via TRANSDERMAL
  Filled 2024-08-24 (×4): qty 1

## 2024-08-24 MED ORDER — ONDANSETRON 4 MG PO TBDP: 4.0000 mg | ORAL_TABLET | Freq: Four times a day (QID) | ORAL | Status: DC | PRN

## 2024-08-24 MED ORDER — MAGNESIUM HYDROXIDE 400 MG/5ML PO SUSP: 30.0000 mL | Freq: Every day | ORAL | Status: DC | PRN

## 2024-08-24 MED ORDER — LORAZEPAM 1 MG PO TABS
1.0000 mg | ORAL_TABLET | Freq: Every day | ORAL | Status: AC
  Administered 2024-08-27: 1 mg via ORAL
  Filled 2024-08-24: qty 1

## 2024-08-24 MED ORDER — LOPERAMIDE HCL 2 MG PO CAPS: 2.0000 mg | ORAL_CAPSULE | ORAL | Status: DC | PRN

## 2024-08-24 MED ORDER — THIAMINE HCL 100 MG/ML IJ SOLN: 100.0000 mg | Freq: Once | INTRAMUSCULAR | Status: DC

## 2024-08-24 MED ORDER — ADULT MULTIVITAMIN W/MINERALS CH
1.0000 | ORAL_TABLET | Freq: Every day | ORAL | Status: DC
  Administered 2024-08-24: 1 via ORAL
  Filled 2024-08-24: qty 1

## 2024-08-24 MED ORDER — ADULT MULTIVITAMIN W/MINERALS CH
1.0000 | ORAL_TABLET | Freq: Every day | ORAL | Status: DC
  Administered 2024-08-25 – 2024-08-29 (×5): 1 via ORAL
  Filled 2024-08-24 (×5): qty 1

## 2024-08-24 NOTE — ED Notes (Signed)
 Pt granddaughter was able to assist RN in removing patient's jewelry and clothing, and change pt into purple scrubs.  Pt granddaughter took possession of all of pt belongings at the request of the pt.

## 2024-08-24 NOTE — ED Triage Notes (Addendum)
 Pt from home via EMS for SI.  Pt granddaughter with pt on arrival.  EMS reports a lot of EtoH.  Pt endorses taking four amitriptyline  in an attempt to end it all.

## 2024-08-24 NOTE — ED Provider Notes (Addendum)
 [place] EMERGENCY DEPARTMENT AT [facility] Provider Note   CSN: [id number] Arrival date & time: 08/24/24  9884     Patient presents with: Suicidal   Sheryl Brown is a 63 y.o. female with history of seizures, strokes, neuropathy, GERD, meningitis.  Presents to ED for evaluation.  Apparently patient took unknown amount of amitriptyline  this evening in an attempt to end her life.  Patient reported to her granddaughter that she took 7 tablets of 1000 mg amitriptyline .  In the patient chart, she is prescribed 50 mg amitriptyline .  She then told EMS that she took 2 tablets of amitriptyline  and then told medical staff here in the department she took 4 tablets.  [employer] officer who responded to scene states to me that he found a bottle that had prescribed 30 tablets to it with 2 tablets missing.  On exam, the patient is unwilling to talk with me about what occurred this evening.  The officer at the bedside as well as the patient granddaughter reports that the patient stated to them that she was taking these pills so that I can go to sleep and not wake up.  The patient then also threatened to shoot [employer] officers on scene.  Patient granddaughter denies a history of psychiatric stabilization.  She does endorse that the patient does have mental health problems.  The patient will not discuss the events that occurred tonight with me.  She is sleeping, heavily intoxicated.  HPI     Prior to Admission medications   Medication Sig Start Date End Date Taking? Authorizing Provider  amitriptyline  (ELAVIL ) 100 MG tablet Take 1 tablet (100 mg total) by mouth at bedtime. 06/23/24 06/18/25  Camara, Amadou, MD  amLODipine  (NORVASC ) 10 MG tablet Take 1 tablet (10 mg total) by mouth daily. 12/24/23   Patt Alm Macho, MD  baclofen  (LIORESAL ) 10 MG tablet Take 1 tablet (10 mg total) by mouth 3 (three) times daily. 12/22/23   Johnie Flaming A, NP  buPROPion  (WELLBUTRIN  XL) 150 MG 24 hr tablet Take 150 mg  by mouth daily. 03/03/24   [provider]  Cholecalciferol 50 MCG (2000 UT) TABS Take 2 tablets by mouth daily. 12/04/23   [provider]  fluconazole  (DIFLUCAN ) 150 MG tablet Take 1 tablet (150 mg total) by mouth daily. 08/28/23   Dreama, [place]  N, FNP  lamoTRIgine  (LAMICTAL ) 150 MG tablet Take 1 tablet (150 mg total) by mouth daily. 06/23/24 06/18/25  Camara, Amadou, MD  meclizine  (ANTIVERT ) 25 MG tablet Take 1 tablet (25 mg total) by mouth 3 (three) times daily as needed for dizziness. 03/31/24   Amin, Ankit C, MD  nicotine  (NICODERM CQ  - DOSED IN MG/24 HR) 7 mg/24hr patch Place 1 patch (7 mg total) onto the skin daily. 03/31/24   Amin, Ankit C, MD  ondansetron  (ZOFRAN -ODT) 4 MG disintegrating tablet Take 1 tablet (4 mg total) by mouth every 8 (eight) hours as needed for nausea or vomiting. 05/29/23   Francesca Elsie CROME, MD  pantoprazole  (PROTONIX ) 40 MG tablet Take 1 tablet (40 mg total) by mouth 2 (two) times daily. 06/02/23   Hildegard Loge, PA-C  PARoxetine (PAXIL) 20 MG tablet Take 20 mg by mouth daily. 10/03/21   [provider]  rosuvastatin (CRESTOR) 20 MG tablet Take 20 mg by mouth at bedtime. 02/25/24   [provider]  SUMAtriptan  (IMITREX ) 50 MG tablet Take 1 tablet (50 mg total) by mouth every 2 (two) hours as needed. May repeat in 2  hours if headache persists or recurs. 06/23/24   Camara, Amadou, MD    Allergies: Patient has no known allergies.    Review of Systems  Psychiatric/Behavioral:  Positive for suicidal ideas.   All other systems reviewed and are negative.   Updated Vital Signs BP (!) 143/77   Pulse 99   Temp 97.6 F (36.4 C) (Oral)   Resp 20   Ht 5' 6 (1.676 m)   Wt 90.7 kg   SpO2 98%   BMI 32.28 kg/m   Physical Exam Vitals and nursing note reviewed.  Constitutional:      General: She is not in acute distress.    Appearance: She is well-developed.  HENT:     Head: Normocephalic and atraumatic.  Eyes:      Conjunctiva/sclera: Conjunctivae normal.  Cardiovascular:     Rate and Rhythm: Normal rate and regular rhythm.     Heart sounds: No murmur heard. Pulmonary:     Effort: Pulmonary effort is normal. No respiratory distress.     Breath sounds: Normal breath sounds.  Abdominal:     Palpations: Abdomen is soft.     Tenderness: There is no abdominal tenderness.  Musculoskeletal:        General: No swelling.     Cervical back: Neck supple.  Skin:    General: Skin is warm and dry.     Capillary Refill: Capillary refill takes less than 2 seconds.  Neurological:     Mental Status: She is alert and oriented to person, place, and time. Mental status is at baseline.     Comments: Alert and oriented.  Follows commands appropriately.  Moves all extremities in coordinated fashion.  Psychiatric:        Mood and Affect: Mood normal.     (all labs ordered are listed, but only abnormal results are displayed) Labs Reviewed  COMPREHENSIVE METABOLIC PANEL WITH GFR - Abnormal; Notable for the following components:      Result Value   CO2 17 (*)    Anion gap 16 (*)    All other components within normal limits  SALICYLATE LEVEL - Abnormal; Notable for the following components:   Salicylate Lvl <7.0 (*)    All other components within normal limits  ACETAMINOPHEN  LEVEL - Abnormal; Notable for the following components:   Acetaminophen  (Tylenol ), Serum <10 (*)    All other components within normal limits  ETHANOL - Abnormal; Notable for the following components:   Alcohol, Ethyl (B) 192 (*)    All other components within normal limits  LIPASE, BLOOD  CBC  URINE DRUG SCREEN    EKG: None  Radiology: No results found.  Procedures   Medications Ordered in the ED - No data to display  Clinical Course as of 08/24/24 0520  Tue Aug 24, 2024  0201 Amitriptyline  overdose consistent with sedation, CNS depression, consistent with opioid overdose. Almost alwys tachy, soft BP. Big seizure risk. Close  eye on QRS. If QRS >120, bicarb push. Repeat EKG in 6 hour. Effects typically 6 hours post ingestion. [CG]  0347 Acetaminophen  4hours post ingestion [CG]    Clinical Course User Index [CG] Ruthell Lonni FALCON, PA-C   Medical Decision Making Amount and/or Complexity of Data Reviewed Labs: ordered. ECG/medicine tests: ordered.   63 year old female presents for evaluation.  On exam, the patient is heavily intoxicated.  She is afebrile and nontachycardic.  Lung sounds are clear bilaterally, no hypoxia.  Abdomen soft and compressible.  Neuroexam at baseline.  Patient  reports that she took these medications tonight, unknown amount of amitriptyline , in an attempt to go to sleep and not wake up.  IVC paperwork filled out at this time.  Patient made ED psych hold.  Screening labs collected.  Patient CBC is grossly unremarkable.  Metabolic panel is grossly unremarkable without electrolyte derangement or elevated LFT.  Ethanol 192.  Acetaminophen  and salicylate undetectable.  Lipase 41.  Discussed patient case with poison control.  They report to me that amitriptyline  overdoses are consistent with sedation, CNS depression, consistent opioid overdoses.  They report the patient is almost always tachycardic with reduced blood pressure.  Advised that I keep watch on QRS and repeat EKG in 6 hours.  Also advised to repeat acetaminophen  level in 5 hours.  Repeat EKG collected without any evidence of QRS prolongation.  Repeat acetaminophen  level collected which is negative.  At this time patient is medically cleared pending TTS disposition.   Final diagnoses:  Suicidal behavior with attempted self-injury [facility])  Alcohol abuse    ED Discharge Orders     None           Solon Alban F, PA-C 08/24/24 9375    Theadore Ozell HERO, MD 08/24/24 [phone]

## 2024-08-24 NOTE — ED Notes (Signed)
 Pt has been dressed out into burgundy scrubs. Pt has one pt belonging bag, that granddaughter has.

## 2024-08-24 NOTE — ED Notes (Signed)
 Report called to [facility] and called for transport.

## 2024-08-24 NOTE — BH Assessment (Signed)
 Clinician contacted [facility] regarding whether patient can be seen.  She said that patient is asleep.  Pt had taken an unknown amount of amitriptyline  and a lot of ETOH (BAL was 192 at 02:18).

## 2024-08-24 NOTE — ED Notes (Signed)
 Jerona attempted to complete TTS on patient was she was unable to stay awake. TTS will be done at a later time.

## 2024-08-24 NOTE — Tx Team (Signed)
 Initial Treatment Plan 08/24/2024 4:47 PM Sheryl Brown [id number]    PATIENT STRESSORS: Substance abuse     PATIENT STRENGTHS: Supportive family/friends    PATIENT IDENTIFIED PROBLEMS: I took some pills                     DISCHARGE CRITERIA:  Improved stabilization in mood, thinking, and/or behavior Motivation to continue treatment in a less acute level of care  PRELIMINARY DISCHARGE PLAN: Attend PHP/IOP Outpatient therapy  PATIENT/FAMILY INVOLVEMENT: This treatment plan has been presented to and reviewed with the patient, Sheryl Brown.  The patient and family have been given the opportunity to ask questions and make suggestions.  Annalee Larch, RN 08/24/2024, 4:47 PM

## 2024-08-24 NOTE — ED Notes (Signed)
 [facility] called pts granddaughter, Latrelle to inform her that pt has been transferred to [facility] for inpatient psychiatric admission. [facility] provided the phone number and address for [facility].   Chesley Holt, [facility]  08/24/24

## 2024-08-24 NOTE — Group Note (Signed)
 Date:  08/24/2024 Time:  3:58 PM  Group Topic/Focus:  Boundaries: The focus of this group is to discuss five boundary types and describes what healthy, porous, and rigid boundaries look like for each. This encourages clients to reflect on their boundaries, understand how they differ, and identify strengths and weaknesses.    Participation Level:  Did Not Attend   Sheryl Brown 08/24/2024, 3:58 PM

## 2024-08-24 NOTE — BH Assessment (Addendum)
 Clinician was contacted by RN Pietro Motto to see patient.  Patient was still very sleepy and was unable to actively participate for more than 2 minutes.  She said however that she took two amitriptyline .  Pt says she took them to help her sleep.  This is contrary to what she told granddaughter, that she had taken 7 of of 1000mg  amitriptyline .  However per the chart she is prescribed 50mg  amitriptyline .    Pt will be seen by psychiatric provider during the day when she is more alert and oriented.

## 2024-08-24 NOTE — ED Notes (Signed)
 Pt has 2 iv's in her L arm that are not documented in her chart.

## 2024-08-24 NOTE — [facility] Group Notes (Signed)
 Psychoeducational Group Note  Date:  08/24/2024 Time:  2000  Group Topic/Focus:  Wrap up group  Participation Level: Did Not Attend  Participation Quality:  Not Applicable  Affect:  Not Applicable  Cognitive:  Not Applicable  Insight:  Not Applicable  Engagement in Group: Not Applicable  Additional Comments:  Did not attend.   [name] 08/24/2024, 8:54 PM

## 2024-08-24 NOTE — Consult Note (Signed)
 [facility] Psychiatric Consult Initial  Patient Name: .Sheryl Brown  MRN: [id number]  DOB: [birth year]  Consult Order details:  Orders (From admission, onward)     Start     Ordered   08/24/24 0131  CONSULT TO CALL ACT TEAM       Ordering Provider: Ruthell Lonni FALCON, PA-C  Provider:  (Not yet assigned)  Question:  Reason for Consult?  Answer:  si   08/24/24 0130             Mode of Visit: In person    Psychiatry Consult Evaluation  Service Date: August 24, 2024 LOS:  LOS: 0 days  Chief Complaint "I took some pills last night because I was feeling down."  Primary Psychiatric Diagnoses  Major Depressive Disorder, recurrent, moderate 2.   Amitriptyline  overdose, intentional self-harm, initial encounter 3.   Alcohol Use Disorder, moderate  Assessment  Sheryl Brown is a 63 y.o. female admitted: Presented to the ED for 08/24/2024  1:16 AM for self harm behavior. She carries the psychiatric diagnoses of depression and anxiety and has a past medical history of seizure disorder, cerebrovascular accidents (strokes), neuropathy, GERD, and meningitis.   63 year old female with major depressive symptoms, chronic medical comorbidities, alcohol use, and a recent suicide attempt via intentional overdose on amitriptyline . Patient demonstrates depressed mood, poor insight, and impaired judgment in the context of multiple psychosocial stressors and grief.  Although she currently denies suicidal ideation, her recent attempt, history of prior overdose, alcohol use, and poor adherence to treatment place her at high risk for recurrent self-harm.  Patient's PHQ-9 score = 13, indicating moderate depression. She meets criteria for inpatient psychiatric admission for safety, stabilization, medication evaluation, and re-engagement in therapy. Please see plan below for detailed recommendations.   Diagnoses:  Active Hospital problems: Principal Problem:   MDD (major depressive  disorder) Active Problems:   GAD (generalized anxiety disorder)    Plan   ## Psychiatric Medication Recommendations:  Hold home psychiatric medications pending inpatient psychiatric evaluation. Avoid restarting amitriptyline  given overdose risk. Initiate alcohol withdrawal precautions if indicated.  ## Medical Decision Making Capacity: Not specifically addressed in this encounter  ## Further Work-up:  -- No further workup needed at this time EKG or UDS -- most recent EKG on 08/24/2024 had QtC of 432 -- Pertinent labwork reviewed earlier this admission includes: CBC, CMP, EKG, UDS, EtOH   ## Disposition:-- We recommend inpatient psychiatric hospitalization after medical hospitalization. Patient has been involuntarily committed on 08/24/2024.   ## Behavioral / Environmental: -To minimize splitting of staff, assign one staff person to communicate all information from the team when feasible. or Utilize compassion and acknowledge the patient's experiences while setting clear and realistic expectations for care.    ## Safety and Observation Level:  - Based on my clinical evaluation, I estimate the patient to be at low risk of self harm in the current setting. - At this time, we recommend  1:1 Observation. This decision is based on my review of the chart including patient's history and current presentation, interview of the patient, mental status examination, and consideration of suicide risk including evaluating suicidal ideation, plan, intent, suicidal or self-harm behaviors, risk factors, and protective factors. This judgment is based on our ability to directly address suicide risk, implement suicide prevention strategies, and develop a safety plan while the patient is in the clinical setting. Please contact our team if there is a concern that risk level has changed.  CSSR Risk Category:C-SSRS RISK  CATEGORY: High Risk  Suicide Risk Assessment: Patient has following modifiable risk  factors for suicide: active suicidal ideation, under treated depression , medication noncompliance, and triggering events, which we are addressing by recommending inpatient psychiatric admission. Patient has following non-modifiable or demographic risk factors for suicide: history of suicide attempt and psychiatric hospitalization Patient has the following protective factors against suicide: Supportive family  Thank you for this consult request. Recommendations have been communicated to the primary team.  We will continue to follow patient at this time.   Sheryl Brown, PMHNP       History of Present Illness  Relevant Aspects of Hospital ED Course:  Admitted on 08/24/2024 for recent attempt, history of prior overdose, alcohol use, and poor adherence to treatment.   Patient Report:  63 year old female with a medical history significant for seizure disorder, cerebrovascular accidents (strokes), neuropathy, GERD, and meningitis, presenting to the emergency department following an intentional overdose on amitriptyline  in a suicide attempt.  Per report, the patient told her granddaughter that she ingested approximately seven tablets of amitriptyline  100 mg each (total 700 mg). She was brought in for medical and psychiatric evaluation.  On evaluation, the patient appears depressed, tearful, and remorseful. She acknowledges that she has been feeling increasingly down and hopeless, citing multiple recent losses among friends and family members. She reports that one of her favorite cousins is currently hospitalized, which has worsened her mood.  Patient states that she lives with her 27 year old granddaughter, receives disability for meningitis-related impairment, and is not currently employed. She reports that she drinks alcohol occasionally, though her blood alcohol level (BAL) was 192 mg/dL on arrival. She admits to drinking liquor earlier that evening. She denies current illicit drug use; UDS  is negative.  The patient denies current suicidal ideation (SI), homicidal ideation (HI), or auditory/visual hallucinations (AVH) during this evaluation, though she acknowledges a prior suicide attempt approximately 30 years ago via overdose that required inpatient treatment in [place] .  She reports taking amitriptyline  for sleep and an antidepressant she cannot recall by name. She also reports that she stopped taking her medications this summer because she wanted to drink alcohol without restrictions. She reports attending therapy through [facility] for approximately 8 months, but that ended earlier this year. She expresses willingness to return to therapy after discharge.  Patient describes feelings of sadness, boredom, and guilt, stating she feels she has "let her granddaughter down." She denies any acute psychosis, but demonstrates low motivation, poor energy, and anhedonia. She verbalizes interest in exercising and going to the gym and was educated about the [employer] senior fitness program through the [facility], which she found encouraging.   Psych ROS:  Depression: Positive Anxiety:  Positive Mania (lifetime and current): Denies  Psychosis: (lifetime and current): Denies   Collateral information:  Spoke with the patient's granddaughter Jami Pouch), who confirms she moved from [facility] in March 2025 to help care for her grandmother. She states that the patient told her she had cancer, which was later found to be untrue.  The granddaughter reports the patient stopped taking all medications (psychiatric and medical) over the summer, stating she wanted to drink alcohol freely. She describes the patient's alcohol use as increasing, often drinking throughout the day while watching TV and occasionally smoking marijuana.  She reports that the patient often makes suicidal statements, such as "I just want to go to sleep and not wake up," though this is the first time she acted on those  thoughts. The granddaughter notes that  the patient's mood appeared fine earlier in the evening before drinking with her boyfriend, but she later became upset and ingested the pills. She attributes worsening mood to grief, isolation, and unresolved losses, including the upcoming anniversary of her best friend's death.  The granddaughter is supportive of inpatient psychiatric treatment and requests to be notified upon transfer.  Review of Systems  Psychiatric/Behavioral:  Positive for depression, substance abuse and suicidal ideas.      Psychiatric and Social History  Psychiatric History:  Information collected from patient and patient's granddaughter  Prev Dx/Sx: Depression and anxiety Current Psych Provider: Denies Home Meds (current): Amitriptyline  and antidepressant Previous Med Trials: Yes Therapy: Denies   Prior Psych Hospitalization: Yes  Prior Self Harm: Denies  Prior Violence: Denies   Family Psych History:  Denies Family Hx suicide:  Denies  Social History:  Developmental Hx: deferred Educational Hx: Graduated high school Occupational Hx: Unemployed Legal Hx:  Denies Living Situation: Has her own home Spiritual Hx: Yes Access to weapons/lethal means:  Denies   Substance History Alcohol: Yes Type of alcohol liquor, preferably [place] Last Drink yesterday Number of drinks per day varies History of alcohol withdrawal seizures  Denies History of DT's  Denies Tobacco: Yes Illicit drugs: Marijuana occasionally Prescription drug abuse:  Denies Rehab hx:  Denies  Exam Findings  Physical Exam:  Vital Signs:  Temp:  [97.6 F (36.4 C)-98.3 F (36.8 C)] 98.3 F (36.8 C) (10/14 0537) Pulse Rate:  [84-99] 88 (10/14 1045) Resp:  [16-20] 16 (10/14 0537) BP: (143-163)/(77-149) 151/93 (10/14 1045) SpO2:  [98 %-100 %] 100 % (10/14 0537) Weight:  [90.7 kg] 90.7 kg (10/14 0126) Blood pressure (!) 151/93, pulse 88, temperature 98.3 F (36.8 C), temperature source Oral,  resp. rate 16, height 5' 6 (1.676 m), weight 90.7 kg, SpO2 100%. Body mass index is 32.28 kg/m.  Physical Exam Psychiatric:        Attention and Perception: Attention normal.        Mood and Affect: Mood is depressed. Affect is tearful.        Speech: Speech normal.        Behavior: Behavior is cooperative.        Thought Content: Thought content includes suicidal ideation.        Cognition and Memory: Cognition is impaired.        Judgment: Judgment is impulsive and inappropriate.     Mental Status Exam: General Appearance: Well-nourished, older adult female, disheveled, mildly intoxicated but cooperative.  Orientation:  Full (Time, Place, and Person)  Memory:  Immediate;   Fair Recent;   Fair  Concentration:  Concentration: Fair  Recall:  Fair  Attention  Fair  Eye Contact:  Fair  Speech:  Clear and Coherent  Language:  Good  Volume:  Decreased  Mood: "Sad."  Affect:  Depressed, congruent with mood.  Thought Process:  Logical, goal-directed.  Thought Content:  Denies current SI/HI/AVH. Expresses guilt and hopelessness.  Suicidal Thoughts:  Yes.  without intent/plan  Homicidal Thoughts:  No  Judgement:  Impaired--recent suicide attempt and medication noncompliance.  Insight:  Fair to poor--minimizes risk of overdose behavior.  Psychomotor Activity:  Normal  Akathisia:  No  Fund of Knowledge:  Fair      Assets:  Manufacturing systems engineer Desire for Improvement Financial Resources/Insurance Housing Social Support  Cognition:  Impaired,  Mild  ADL's:  Impaired  AIMS (if indicated):        Other History   These have been pulled  in through the EMR, reviewed, and updated if appropriate.  Family History:  The patient's family history includes Cirrhosis in her father; Heart failure in her mother.  Medical History: Past Medical History:  Diagnosis Date   GERD (gastroesophageal reflux disease)    Meningitis 2013   Neuropathy    Seasonal allergies    Seizures (HCC)     Stroke [facility])     Surgical History: Past Surgical History:  Procedure Laterality Date   CESAREAN SECTION       Medications:   Current Facility-Administered Medications:    hydrOXYzine (ATARAX) tablet 25 mg, 25 mg, Oral, Q6H PRN, Brown, Shatara Stanek A, PMHNP   loperamide (IMODIUM) capsule 2-4 mg, 2-4 mg, Oral, PRN, Brown, Fain Francis A, PMHNP   LORazepam  (ATIVAN ) tablet 1 mg, 1 mg, Oral, Q6H PRN, Brown, Masako Overall A, PMHNP   LORazepam  (ATIVAN ) tablet 1 mg, 1 mg, Oral, QID **FOLLOWED BY** [START ON 08/25/2024] LORazepam  (ATIVAN ) tablet 1 mg, 1 mg, Oral, TID **FOLLOWED BY** [START ON 08/26/2024] LORazepam  (ATIVAN ) tablet 1 mg, 1 mg, Oral, BID **FOLLOWED BY** [START ON 08/27/2024] LORazepam  (ATIVAN ) tablet 1 mg, 1 mg, Oral, Daily, Brown, Daivd Fredericksen A, PMHNP   multivitamin with minerals tablet 1 tablet, 1 tablet, Oral, Daily, Brown, Alyjah Lovingood A, PMHNP   nicotine  (NICODERM CQ  - dosed in mg/24 hours) patch 14 mg, 14 mg, Transdermal, Daily, Brown, Linh Johannes A, PMHNP   nicotine  polacrilex (NICORETTE) gum 2 mg, 2 mg, Oral, Q4H while awake, Brown, Joeanna Howdyshell A, PMHNP   ondansetron  (ZOFRAN -ODT) disintegrating tablet 4 mg, 4 mg, Oral, Q6H PRN, Brown, Benjamin Casanas A, PMHNP   thiamine (VITAMIN B1) injection 100 mg, 100 mg, Intramuscular, Once, Brown, Merik Mignano A, PMHNP   [START ON 08/25/2024] thiamine (VITAMIN B1) tablet 100 mg, 100 mg, Oral, Daily, Brown, Joceline Hinchcliff A, PMHNP  Current Outpatient Medications:    LINZESS  145 MCG CAPS capsule, Take 145 mcg by mouth daily., Disp: , Rfl:    amitriptyline  (ELAVIL ) 100 MG tablet, Take 1 tablet (100 mg total) by mouth at bedtime., Disp: 90 tablet, Rfl: 3   amLODipine  (NORVASC ) 10 MG tablet, Take 1 tablet (10 mg total) by mouth daily., Disp: 30 tablet, Rfl: 0   baclofen  (LIORESAL ) 10 MG tablet, Take 1 tablet (10 mg total) by mouth 3 (three) times daily., Disp: 30 each, Rfl: 0   buPROPion  (WELLBUTRIN  XL) 150 MG 24  hr tablet, Take 150 mg by mouth daily., Disp: , Rfl:    Cholecalciferol 50 MCG (2000 UT) TABS, Take 2 tablets by mouth daily., Disp: , Rfl:    [Paused] fluconazole  (DIFLUCAN ) 150 MG tablet, Take 1 tablet (150 mg total) by mouth daily., Disp: 1 tablet, Rfl: 0   lamoTRIgine  (LAMICTAL ) 150 MG tablet, Take 1 tablet (150 mg total) by mouth daily., Disp: 90 tablet, Rfl: 3   lubiprostone (AMITIZA) 24 MCG capsule, Take by mouth., Disp: , Rfl:    meclizine  (ANTIVERT ) 25 MG tablet, Take 1 tablet (25 mg total) by mouth 3 (three) times daily as needed for dizziness., Disp: 20 tablet, Rfl: 0   nicotine  (NICODERM CQ  - DOSED IN MG/24 HR) 7 mg/24hr patch, Place 1 patch (7 mg total) onto the skin daily., Disp: 28 patch, Rfl: 0   ondansetron  (ZOFRAN -ODT) 4 MG disintegrating tablet, Take 1 tablet (4 mg total) by mouth every 8 (eight) hours as needed for nausea or vomiting., Disp: 20 tablet, Rfl: 0   pantoprazole  (PROTONIX ) 40 MG tablet, Take 1 tablet (40 mg total) by mouth 2 (two) times daily., Disp: 60  tablet, Rfl: 0   [Paused] PARoxetine (PAXIL) 20 MG tablet, Take 20 mg by mouth daily., Disp: , Rfl:    rosuvastatin (CRESTOR) 20 MG tablet, Take 20 mg by mouth at bedtime., Disp: , Rfl:    SUMAtriptan  (IMITREX ) 50 MG tablet, Take 1 tablet (50 mg total) by mouth every 2 (two) hours as needed. May repeat in 2 hours if headache persists or recurs., Disp: 10 tablet, Rfl: 6  Allergies: No Known Allergies  Mally Gavina Brown, PMHNP

## 2024-08-24 NOTE — Progress Notes (Signed)
 [facility] Admission Note:   Patient is a 63 year old female with a PMH significant for stroke and seizures was admitted under IVC for SI and consumption of 4 amitriptyline  tablets and ETOH. BAL was 192. On admission patient required assistance to ambulate and a Matteucci was utilized. Patient was somnolent and required awakening several times throughout admission process. Patient denies SI/HI/AVH and is oriented X 4. Consents were signed and skin was assessed WNL with Celena MHT. Fall risk was assessed as high and fall risk precautions were implemented. Patient was oriented to the unit and safety checks were initiated at 15 minute intervals

## 2024-08-24 NOTE — Plan of Care (Signed)
  Problem: Education: Goal: Knowledge of [facility] Education information/materials will improve Outcome: Progressing   Problem: Coping: Goal: Ability to verbalize frustrations and anger appropriately will improve Outcome: Progressing   Problem: Safety: Goal: Periods of time without injury will increase Outcome: Progressing   Problem: Activity: Goal: Interest or engagement in activities will improve Outcome: Not Progressing

## 2024-08-24 NOTE — Progress Notes (Signed)
 Patient refused scheduled lorazepam  due to being sedated and patient states I'm not going through withdrawals.

## 2024-08-24 NOTE — Plan of Care (Signed)
   Problem: Education: Goal: Knowledge of [facility] Education information/materials will improve Outcome: Progressing Goal: Emotional status will improve Outcome: Progressing Goal: Verbalization of understanding the information provided will improve Outcome: Progressing

## 2024-08-24 NOTE — Progress Notes (Signed)
 Pt has been accepted to [facility] on 08/24/2024 Bed assignment: 304-02  Pt meets inpatient criteria per: Cathaleen Jacobson NP  Attending Physician will be: Dr. Prentis    Report can be called to:  unit: Adult unit: [phone]  Pt can arrive after [facility] WILL UPDATE   Care Team Notified: [facility] Danika Carlo RN, Cathaleen Mangrum NP  [place] Rayson Rando LCSW-A   08/24/2024 11:11 AM

## 2024-08-25 ENCOUNTER — Encounter (HOSPITAL_COMMUNITY): Payer: Self-pay

## 2024-08-25 DIAGNOSIS — R4189 Other symptoms and signs involving cognitive functions and awareness: Secondary | ICD-10-CM

## 2024-08-25 DIAGNOSIS — F1020 Alcohol dependence, uncomplicated: Secondary | ICD-10-CM

## 2024-08-25 MED ORDER — PANTOPRAZOLE SODIUM 40 MG PO TBEC
40.0000 mg | DELAYED_RELEASE_TABLET | Freq: Two times a day (BID) | ORAL | Status: DC
  Administered 2024-08-25 – 2024-08-29 (×8): 40 mg via ORAL
  Filled 2024-08-25 (×8): qty 1

## 2024-08-25 MED ORDER — SUCRALFATE 1 GM/10ML PO SUSP: 1.0000 g | Freq: Every day | ORAL | Status: DC

## 2024-08-25 MED ORDER — LAMOTRIGINE 25 MG PO TABS
150.0000 mg | ORAL_TABLET | Freq: Every day | ORAL | Status: DC
  Administered 2024-08-25 – 2024-08-29 (×5): 150 mg via ORAL
  Filled 2024-08-25 (×5): qty 2

## 2024-08-25 MED ORDER — FLUOXETINE HCL 20 MG PO CAPS
20.0000 mg | ORAL_CAPSULE | Freq: Every day | ORAL | Status: DC
  Administered 2024-08-26 – 2024-08-29 (×4): 20 mg via ORAL
  Filled 2024-08-25 (×4): qty 1

## 2024-08-25 MED ORDER — BUPROPION HCL ER (XL) 150 MG PO TB24
150.0000 mg | ORAL_TABLET | Freq: Every day | ORAL | Status: DC
  Administered 2024-08-25: 150 mg via ORAL
  Filled 2024-08-25: qty 1

## 2024-08-25 MED ORDER — AMLODIPINE BESYLATE 5 MG PO TABS
5.0000 mg | ORAL_TABLET | Freq: Every day | ORAL | Status: DC
  Administered 2024-08-25 – 2024-08-29 (×5): 5 mg via ORAL
  Filled 2024-08-25 (×5): qty 1

## 2024-08-25 MED ORDER — VITAMIN D 25 MCG (1000 UNIT) PO TABS
2000.0000 [IU] | ORAL_TABLET | Freq: Every day | ORAL | Status: DC
  Administered 2024-08-25 – 2024-08-29 (×5): 2000 [IU] via ORAL
  Filled 2024-08-25 (×5): qty 2

## 2024-08-25 MED ORDER — LUBIPROSTONE 24 MCG PO CAPS
24.0000 ug | ORAL_CAPSULE | Freq: Two times a day (BID) | ORAL | Status: DC
  Administered 2024-08-25 – 2024-08-29 (×8): 24 ug via ORAL
  Filled 2024-08-25 (×11): qty 1

## 2024-08-25 MED ORDER — CYCLOBENZAPRINE HCL 10 MG PO TABS
5.0000 mg | ORAL_TABLET | Freq: Three times a day (TID) | ORAL | Status: DC | PRN
Start: 2024-08-25 — End: 2024-08-29

## 2024-08-25 MED ORDER — ROSUVASTATIN CALCIUM 20 MG PO TABS
20.0000 mg | ORAL_TABLET | Freq: Every day | ORAL | Status: DC
  Administered 2024-08-25 – 2024-08-29 (×5): 20 mg via ORAL
  Filled 2024-08-25 (×5): qty 1

## 2024-08-25 NOTE — [facility] Suicide Risk Assessment (Signed)
 [facility] Admission Suicide Risk Assessment    Principal Problem: Major depressive disorder, recurrent severe without psychotic features (HCC) Diagnosis:  Principal Problem:   Major depressive disorder, recurrent severe without psychotic features (HCC) Active Problems:   Moderate alcohol use disorder (HCC)   Cognitive deficits   Suicide Risk Assessment: The patient presents with acute risk factors for suicide including attempted suicide via OD (amitriptyline ), alcohol use with intoxication at time of OD, and active depression. She additionally carries chronic risk factors including chronic pain/disability, lack of social supports, psychosocial stressors (loss of best friend with anniversary upcoming), history of prior suicide attempts (one prior OD in her 30s). Currently, she is presenting as dysphoric in affect, endorsing ongoing depressed mood and is reporting ambivalence about being alive - part of her wishing she had been successful. Although denying frank SI today and presenting with some protective factors (including female gender, housing, source of income and family support, in a relationship) she remains high risk of suicide at this time.   IVC has been upheld with plan to adjust medications and provide group and individual therapy. Will also provide motivational interviewing and address substance use concerns during this admission.   I certify that inpatient services furnished can reasonably be expected to improve the patient's condition.   Sheryl LOISE Arts, MD 08/25/2024, 11:57 AM

## 2024-08-25 NOTE — Group Note (Signed)
 Recreation Therapy Group Note   Group Topic:Problem Solving  Group Date: 08/25/2024 Start Time: 0940 End Time: 1010 Facilitators: Yoan Sallade-McCall, LRT,CTRS Location: [address] Dayroom   Group Topic: Communication, Team Building, Problem Solving   Goal Area(s) Addresses:  Patient will effectively work with peer towards shared goal.  Patient will identify skills used to make activity successful.  Patient will identify how skills used during activity can be used to reach post d/c goals.    Behavioral Response:    Intervention: STEM Activity   Activity: Landing Pad. In teams of 3-5, patients were given 12 plastic drinking straws and an equal length of masking tape. Using the materials provided, patients were asked to build a landing pad to catch a golf ball dropped from approximately 5 feet in the air. All materials were required to be used by the team in their design. LRT facilitated post-activity discussion.   Education: Pharmacist, community, Scientist, physiological, Discharge Planning    Education Outcome: Acknowledges education/In group clarification offered/Needs additional education.    Affect/Mood: N/A   Participation Level: Did not attend    Clinical Observations/Individualized Feedback:      Plan: Continue to engage patient in RT group sessions 2-3x/week.   Kenya Kook-McCall, LRT,CTRS 08/25/2024 11:41 AM

## 2024-08-25 NOTE — Progress Notes (Signed)
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
## 2024-08-25 NOTE — [facility] Group Notes (Signed)
Patient attended the NA group. ?

## 2024-08-25 NOTE — Group Note (Addendum)
 Date:  08/25/2024 Time:  4:40 PM  Group Topic/Focus:  Personal Choices and Values:   The focus of this group is to help patients assess and explore the importance of values in their lives, how their values affect their decisions, how they express their values and what opposes their expression.    Participation Level:  Did Not Attend   Huel Mall 08/25/2024, 4:40 PM

## 2024-08-25 NOTE — Progress Notes (Signed)
   08/25/24 2200  Psych Admission Type (Psych Patients Only)  Admission Status Voluntary  Psychosocial Assessment  Patient Complaints Anxiety  Eye Contact Brief  Facial Expression Anxious  Affect Depressed  Speech Logical/coherent  Interaction Guarded  Motor Activity Slow  Appearance/Hygiene Disheveled  Behavior Characteristics Cooperative  Mood Depressed  Thought Process  Coherency WDL  Content Preoccupation  Delusions None reported or observed  Perception WDL  Hallucination None reported or observed  Judgment Poor  Confusion None  Danger to Self  Current suicidal ideation? Denies  Danger to Others  Danger to Others None reported or observed

## 2024-08-25 NOTE — Progress Notes (Signed)
(  Sleep Hours) - 8.5 (Any PRNs that were needed, meds refused, or side effects to meds)- none (Any disturbances and when (visitation, over night)-n/a (Concerns raised by the patient)- none (SI/HI/AVH)- denies
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
## 2024-08-25 NOTE — [facility] Suicide Risk Assessment (Signed)
 [facility] INPATIENT:  Family/Significant Other Suicide Prevention Education  Suicide Prevention Education:  Education Completed; Anabel Washington  (granddaughter) [phone],  (name of family member/significant other) has been identified by the patient as the family member/significant other with whom the patient will be residing, and identified as the person(s) who will aid the patient in the event of a mental health crisis (suicidal ideations/suicide attempt).  With written consent from the patient, the family member/significant other has been provided the following suicide prevention education, prior to the and/or following the discharge of the patient.  The suicide prevention education provided includes the following: Suicide risk factors Suicide prevention and interventions National Suicide Hotline telephone number [facility] assessment telephone number [facility] Emergency Assistance 911 [facility] and/or Residential Mobile Crisis Unit telephone number  Request made of family/significant other to: Remove weapons (e.g., guns, rifles, knives), all items previously/currently identified as safety concern.   Remove drugs/medications (over-the-counter, prescriptions, illicit drugs), all items previously/currently identified as a safety concern.  Anabel stated Adelena recently lost one of her best friends and has a lot of health problems but is not consistent with medications. Anabel stated the patient drinks heavily, at least 3-4x/week. According to Bethsaida, [place] has always battled with depression but she has never had suicide plan or attempts. Anabel states Shonia complains of being bored and not having much to do and this past week she was stating things such as I want to go to sleep and not wake up and began saying her goodbyes to people. Anabel corroborates that Alisson has not a lot of people recently including her mother and a couple cousins within the last year.   Anabel believes  therapy would be highly beneficial for [facility]. Anabel owns a firearm that she agrees to lock up away from patient's access, and she also agrees to safely store the patient's medications away and help her take her daily doses. Anabel can provide transportation for [place] upon discharge, and knows who to contact in the event of another mental health crisis.   The family member/significant other verbalizes understanding of the suicide prevention education information provided.  The family member/significant other agrees to remove the items of safety concern listed above.  Louetta Lame 08/25/2024, 3:21 PM

## 2024-08-25 NOTE — Plan of Care (Signed)
  Problem: Education: Goal: Knowledge of [place] Education information/materials will improve Outcome: Progressing Goal: Mental status will improve Outcome: Progressing   Problem: Coping: Goal: Ability to verbalize frustrations and anger appropriately will improve Outcome: Progressing Goal: Ability to demonstrate self-control will improve Outcome: Progressing

## 2024-08-25 NOTE — [facility] IP Treatment Plan (Signed)
 Interdisciplinary Treatment and Diagnostic Plan Update  08/25/2024 Time of Session: 1000AM Sheryl Brown MRN: [id number]  Principal Diagnosis: Major depressive disorder, recurrent severe without psychotic features [facility])  Secondary Diagnoses: Principal Problem:   Major depressive disorder, recurrent severe without psychotic features (HCC) Active Problems:   Moderate alcohol use disorder (HCC)   Cognitive deficits   Current Medications:  Current Facility-Administered Medications  Medication Dose Route Frequency Provider Last Rate Last Admin   acetaminophen  (TYLENOL ) tablet 650 mg  650 mg Oral Q6H PRN Motley-Mangrum, Jadeka A, PMHNP       alum & mag hydroxide-simeth (MAALOX/MYLANTA) 200-200-20 MG/5ML suspension 30 mL  30 mL Oral Q4H PRN Motley-Mangrum, Jadeka A, PMHNP       amLODipine  (NORVASC ) tablet 5 mg  5 mg Oral Daily Butler, Laura N, MD   5 mg at 08/25/24 9047   cholecalciferol (VITAMIN D3) 25 MCG (1000 UNIT) tablet 2,000 Units  2,000 Units Oral Daily Towana Leita SAILOR, MD   2,000 Units at 08/25/24 9046   cyclobenzaprine (FLEXERIL) tablet 5 mg  5 mg Oral TID PRN Towana Leita SAILOR, MD       [START ON 08/26/2024] FLUoxetine (PROZAC) capsule 20 mg  20 mg Oral Daily Towana Leita SAILOR, MD       hydrOXYzine (ATARAX) tablet 25 mg  25 mg Oral Q6H PRN Motley-Mangrum, Jadeka A, PMHNP       lamoTRIgine  (LAMICTAL ) tablet 150 mg  150 mg Oral Daily Butler, Laura N, MD   150 mg at 08/25/24 0953   loperamide (IMODIUM) capsule 2-4 mg  2-4 mg Oral PRN Motley-Mangrum, Jadeka A, PMHNP       LORazepam  (ATIVAN ) tablet 1 mg  1 mg Oral TID Motley-Mangrum, Jadeka A, PMHNP   1 mg at 08/25/24 1246   Followed by   NOREEN ON 08/26/2024] LORazepam  (ATIVAN ) tablet 1 mg  1 mg Oral BID Motley-Mangrum, Jadeka A, PMHNP       Followed by   NOREEN ON 08/27/2024] LORazepam  (ATIVAN ) tablet 1 mg  1 mg Oral Daily Motley-Mangrum, Jadeka A, PMHNP       lubiprostone (AMITIZA) capsule 24 mcg  24 mcg Oral BID WC Butler, Laura N,  MD       magnesium hydroxide (MILK OF MAGNESIA) suspension 30 mL  30 mL Oral Daily PRN Motley-Mangrum, Jadeka A, PMHNP       multivitamin with minerals tablet 1 tablet  1 tablet Oral Daily Motley-Mangrum, Jadeka A, PMHNP   1 tablet at 08/25/24 0946   nicotine  (NICODERM CQ  - dosed in mg/24 hours) patch 14 mg  14 mg Transdermal Daily Motley-Mangrum, Jadeka A, PMHNP   14 mg at 08/25/24 0946   ondansetron  (ZOFRAN -ODT) disintegrating tablet 4 mg  4 mg Oral Q6H PRN Motley-Mangrum, Jadeka A, PMHNP       pantoprazole  (PROTONIX ) EC tablet 40 mg  40 mg Oral BID AC Towana Leita SAILOR, MD       rosuvastatin (CRESTOR) tablet 20 mg  20 mg Oral Daily Butler, Laura N, MD       PTA Medications: Medications Prior to Admission  Medication Sig Dispense Refill Last Dose/Taking   amitriptyline  (ELAVIL ) 100 MG tablet Take 1 tablet (100 mg total) by mouth at bedtime. 90 tablet 3 08/24/2024   lamoTRIgine  (LAMICTAL ) 150 MG tablet Take 1 tablet (150 mg total) by mouth daily. 90 tablet 3 Unknown   lubiprostone (AMITIZA) 24 MCG capsule Take 24 mcg by mouth daily.   Unknown   meclizine  (ANTIVERT ) 25 MG  tablet Take 1 tablet (25 mg total) by mouth 3 (three) times daily as needed for dizziness. 20 tablet 0 Unknown   ondansetron  (ZOFRAN -ODT) 4 MG disintegrating tablet Take 1 tablet (4 mg total) by mouth every 8 (eight) hours as needed for nausea or vomiting. 20 tablet 0 Unknown   rosuvastatin (CRESTOR) 20 MG tablet Take 20 mg by mouth at bedtime.   Unknown   SUMAtriptan  (IMITREX ) 50 MG tablet Take 1 tablet (50 mg total) by mouth every 2 (two) hours as needed. May repeat in 2 hours if headache persists or recurs. 10 tablet 6 Unknown   Vonoprazan Fumarate (VOQUEZNA) 20 MG TABS Take 20 mg by mouth daily.   Unknown    Patient Stressors: Substance abuse    Patient Strengths: Supportive family/friends   Treatment Modalities: Medication Management, Group therapy, Case management,  1 to 1 session with clinician, Psychoeducation,  Recreational therapy.   Physician Treatment Plan for Primary Diagnosis: Major depressive disorder, recurrent severe without psychotic features (HCC) Long Term Goal(s):     Short Term Goals:    Medication Management: Evaluate patient's response, side effects, and tolerance of medication regimen.  Therapeutic Interventions: 1 to 1 sessions, Unit Group sessions and Medication administration.  Evaluation of Outcomes: Not Progressing  Physician Treatment Plan for Secondary Diagnosis: Principal Problem:   Major depressive disorder, recurrent severe without psychotic features (HCC) Active Problems:   Moderate alcohol use disorder (HCC)   Cognitive deficits  Long Term Goal(s):     Short Term Goals:       Medication Management: Evaluate patient's response, side effects, and tolerance of medication regimen.  Therapeutic Interventions: 1 to 1 sessions, Unit Group sessions and Medication administration.  Evaluation of Outcomes: Not Progressing   RN Treatment Plan for Primary Diagnosis: Major depressive disorder, recurrent severe without psychotic features (HCC) Long Term Goal(s): Knowledge of disease and therapeutic regimen to maintain health will improve  Short Term Goals: Ability to remain free from injury will improve, Ability to verbalize frustration and anger appropriately will improve, Ability to demonstrate self-control, Ability to participate in decision making will improve, Ability to verbalize feelings will improve, Ability to disclose and discuss suicidal ideas, Ability to identify and develop effective coping behaviors will improve, and Compliance with prescribed medications will improve  Medication Management: RN will administer medications as ordered by provider, will assess and evaluate patient's response and provide education to patient for prescribed medication. RN will report any adverse and/or side effects to prescribing provider.  Therapeutic Interventions: 1 on 1  counseling sessions, Psychoeducation, Medication administration, Evaluate responses to treatment, Monitor vital signs and CBGs as ordered, Perform/monitor CIWA, COWS, AIMS and Fall Risk screenings as ordered, Perform wound care treatments as ordered.  Evaluation of Outcomes: Not Progressing   LCSW Treatment Plan for Primary Diagnosis: Major depressive disorder, recurrent severe without psychotic features (HCC) Long Term Goal(s): Safe transition to appropriate next level of care at discharge, Engage patient in therapeutic group addressing interpersonal concerns.  Short Term Goals: Engage patient in aftercare planning with referrals and resources, Increase social support, Increase ability to appropriately verbalize feelings, Increase emotional regulation, Facilitate acceptance of mental health diagnosis and concerns, Facilitate patient progression through stages of change regarding substance use diagnoses and concerns, Identify triggers associated with mental health/substance abuse issues, and Increase skills for wellness and recovery  Therapeutic Interventions: Assess for all discharge needs, 1 to 1 time with Social worker, Explore available resources and support systems, Assess for adequacy in community support network, Educate  family and significant other(s) on suicide prevention, Complete Psychosocial Assessment, Interpersonal group therapy.  Evaluation of Outcomes: Not Progressing   Progress in Treatment: Attending groups: No. Participating in groups: No. Taking medication as prescribed: Yes. Toleration medication: Yes. Family/Significant other contact made: No, will contact:  consents pending Patient understands diagnosis: Yes. Discussing patient identified problems/goals with staff: Yes. Medical problems stabilized or resolved: Yes. Denies suicidal/homicidal ideation: Yes. Issues/concerns per patient self-inventory: No.  New problem(s) identified: No, Describe:  none  New Short  Term/Long Term Goal(s): medication management for mood stabilization; elimination of SI thoughts; development of comprehensive mental wellness/sobriety plan   Patient Goals:  Get a therapist that I can see  Discharge Plan or Barriers: Patient recently admitted. CSW will continue to follow and assess for appropriate referrals and possible discharge planning.    Reason for Continuation of Hospitalization: Depression Medication stabilization  Estimated Length of Stay: 5-7 days  Last 3 [place] Suicide Severity Risk Score: Flowsheet Row [facility]Current[facility] Most recent reading at 08/24/2024  3:00 PM ED from 08/24/2024 in [facility] Emergency Department at [facility] Most recent reading at 08/24/2024  1:29 AM ED to Hosp-Admission (Discharged) from 03/27/2024 in [place] LONG 4TH FLOOR PROGRESSIVE CARE AND UROLOGY Most recent reading at 03/27/2024  7:32 PM  C-SSRS RISK CATEGORY High Risk High Risk No Risk    Last PHQ 2/9 Scores:    08/24/2024   11:00 AM  Depression screen PHQ 2/9  Decreased Interest 1  Down, Depressed, Hopeless 2  PHQ - 2 Score 3  Altered sleeping 2  Tired, decreased energy 1  Change in appetite 1  Feeling bad or failure about yourself  2  Trouble concentrating 1  Moving slowly or fidgety/restless 1  Suicidal thoughts 2  PHQ-9 Score 13    Scribe for Treatment Team: Jenkins LULLA Primer, LCSWA 08/25/2024 1:36 PM

## 2024-08-25 NOTE — Progress Notes (Signed)
   08/25/24 0920  Psych Admission Type (Psych Patients Only)  Admission Status Voluntary  Psychosocial Assessment  Patient Complaints Anxiety  Eye Contact Brief  Facial Expression Anxious  Affect Depressed  Speech Logical/coherent  Interaction Guarded  Motor Activity Slow  Appearance/Hygiene Disheveled  Behavior Characteristics Cooperative  Mood Depressed  Thought Process  Coherency WDL  Content Preoccupation  Delusions None reported or observed  Perception WDL  Hallucination None reported or observed  Judgment Poor  Confusion None  Danger to Self  Current suicidal ideation? Denies  Danger to Others  Danger to Others None reported or observed

## 2024-08-26 NOTE — Progress Notes (Signed)
 Patient requests a medical bed because of her sleep apnea. The flat bed makes it feel like she is choking in her sleep.

## 2024-08-26 NOTE — Progress Notes (Signed)
 Pt did not attend goals group.

## 2024-08-26 NOTE — Group Note (Signed)
 LCSW Group Therapy Note   Group Date: 08/26/2024 Start Time: 1100 End Time: 1200   Participation:  patient was present.  She listened and was respectful but didn't participate in the discussion.    Type of Therapy:  Group Therapy  Topic:  Shining from Within:  Confidence and Self-Love Journey  Objective:  To support participants in developing confidence and self-love through self-awareness, self-compassion, and practical skills that nurture personal growth.   Group Goals Encourage self-reflection and self-acceptance by identifying personal strengths and achievements. Teach skills to challenge negative self-talk and replace it with supportive, truthful self-talk. Foster resilience and self-worth through [employer], gratitude, and self-care practices.   Summary:  This group explores the connection between confidence and self-love by guiding participants through reflection, mindset shifts, and practical tools like affirmations, strength recognition, and goal-setting. Activities are designed to promote self-compassion, build emotional resilience, and normalize the slow, patient journey of inner growth.   Therapeutic Modalities Used: Cognitive Behavioral Therapy (CBT): Challenging and reframing unhelpful self-talk. Motivational Interviewing (MI): Encouraging small, achievable goals. Elements of Dialectical Behavioral Therapist (DBT):  Mindfulness and Self-Compassion: Promoting present-moment awareness and kindness toward self.   Tahirah Sara O Dava Rensch, LCSWA 08/26/2024  12:20 PM

## 2024-08-26 NOTE — Care Management Important Message (Signed)
 Medicare IM printed and given to social work to give to the patient. ?

## 2024-08-26 NOTE — Group Note (Signed)
 Date:  08/26/2024 Time:  9:06 PM  Group Topic/Focus:  Wrap-Up Group:   The focus of this group is to help patients review their daily goal of treatment and discuss progress on daily workbooks.    Participation Level:  Did Not Attend  Participation Quality:  Patient did not attend group  Affect:  Resistant  Cognitive:  Lacking  Insight: None  Engagement in Group:  None  Modes of Intervention:  Discussion  Additional Comments:  Patient did not attend wrap up group this evening   Bari Moats 08/26/2024, 9:06 PM

## 2024-08-26 NOTE — Plan of Care (Signed)
  Problem: Education: Goal: Emotional status will improve Outcome: Progressing Goal: Mental status will improve Outcome: Progressing   Problem: Activity: Goal: Interest or engagement in activities will improve Outcome: Progressing   Problem: Coping: Goal: Ability to verbalize frustrations and anger appropriately will improve Outcome: Progressing Goal: Ability to demonstrate self-control will improve Outcome: Progressing   

## 2024-08-26 NOTE — Plan of Care (Signed)
   Problem: Education: Goal: Knowledge of [place] [facility] Education information/materials will improve Outcome: Progressing Goal: Emotional status will improve Outcome: Progressing Goal: Mental status will improve Outcome: Progressing Goal: Verbalization of understanding the information provided will improve Outcome: Progressing
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
## 2024-08-26 NOTE — Progress Notes (Signed)
   08/26/24 0900  Psych Admission Type (Psych Patients Only)  Admission Status Voluntary  Psychosocial Assessment  Patient Complaints Anxiety  Eye Contact Brief  Facial Expression Anxious  Affect Depressed;Anxious  Speech Logical/coherent  Interaction Guarded  Motor Activity Slow  Appearance/Hygiene Disheveled  Behavior Characteristics Cooperative  Mood Anxious;Depressed  Thought Process  Coherency WDL  Content Preoccupation  Delusions None reported or observed  Perception WDL  Hallucination None reported or observed  Judgment Poor  Confusion None  Danger to Self  Current suicidal ideation? Denies  Danger to Others  Danger to Others None reported or observed

## 2024-08-27 NOTE — BH Assessment (Signed)
(  Sleep Hours) - 10 (Any PRNs that were needed, meds refused, or side effects to meds)-  (Any disturbances and when (visitation, over night)- None (Concerns raised by the patient)- C-Pap in the cabinet (SI/HI/AVH)- Denies

## 2024-08-27 NOTE — Progress Notes (Signed)
   08/27/24 0925  Psych Admission Type (Psych Patients Only)  Admission Status Voluntary  Psychosocial Assessment  Patient Complaints None  Eye Contact Fair  Facial Expression Anxious  Affect Anxious  Speech Logical/coherent  Interaction Assertive  Motor Activity Other (Comment) (wnl)  Appearance/Hygiene Improved  Behavior Characteristics Cooperative  Mood Anxious  Thought Process  Coherency WDL  Content Preoccupation  Delusions None reported or observed  Perception WDL  Hallucination None reported or observed  Judgment Poor  Confusion None  Danger to Self  Current suicidal ideation? Denies  Danger to Others  Danger to Others None reported or observed
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
## 2024-08-27 NOTE — Group Note (Signed)
 Recreation Therapy Group Note   Group Topic:Health and Wellness  Group Date: 08/27/2024 Start Time: 0940 End Time: 1005 Facilitators: Talor Cheema-McCall, LRT,CTRS Location: [address] Dayroom   Group Topic: Wellness  Goal Area(s) Addresses:  Patient will define components of whole wellness. Patient will verbalize benefit of whole wellness.  Behavioral Response: Engaged  Intervention: Music  Activity: Exercise. Patients took turns leading group in the exercises of their choosing. Patients determined the difficulty of the exercises presented in group. Patients were encouraged not to do any exercise that was to difficult or aggravated any previous injuries. Patients were also encouraged to take breaks or get water  as needed.     Education: Wellness, Building control surveyor.   Education Outcome: Acknowledges education/In group clarification offered/Needs additional education.    Affect/Mood: Appropriate   Participation Level: Engaged   Participation Quality: Independent   Behavior: Appropriate   Speech/Thought Process: Focused   Insight: Good   Judgement: Good   Modes of Intervention: Music   Patient Response to Interventions:  Engaged   Education Outcome:  In group clarification offered    Clinical Observations/Individualized Feedback: Pt was quiet but engaged in the activity. Pt completed exercises to best of ability. Pt also led group in a few seated exercises.      Plan: Continue to engage patient in RT group sessions 2-3x/week.   Ayelet Gruenewald-McCall, LRT,CTRS 08/27/2024 11:56 AM

## 2024-08-27 NOTE — Plan of Care (Signed)
   Problem: Education: Goal: Emotional status will improve Outcome: Progressing Goal: Mental status will improve Outcome: Progressing Goal: Verbalization of understanding the information provided will improve Outcome: Progressing

## 2024-08-27 NOTE — Group Note (Signed)
 Date:  08/27/2024 Time:  10:09 AM  Group Topic/Focus:  Goals Group:   The focus of this group is to help patients establish daily goals to achieve during treatment and discuss how the patient can incorporate goal setting into their daily lives to aide in recovery.    Participation Level:  Active  Participation Quality:  Appropriate  Affect:  Appropriate  Cognitive:  Appropriate  Insight: Appropriate  Engagement in Group:  Improving  Modes of Intervention:  Orientation  Additional Comments:  She came in to get help. She said that she feels like she is able to cope with her emotions outside the world.  Captain Blucher M Zaeda Mcferran 08/27/2024, 10:09 AM

## 2024-08-27 NOTE — Group Note (Signed)
 Date:  08/27/2024 Time:  4:26 PM  Group Topic/Focus: Identifying resources and support systems Crisis Planning:   The purpose of this group is to help patients create a crisis plan for use upon discharge or in the future, as needed.    Participation Level:  Did Not Attend   Sheryl Brown 08/27/2024, 4:26 PM

## 2024-08-27 NOTE — Progress Notes (Signed)
(  Sleep Hours) - (Any PRNs that were needed, meds refused, or side effects to meds)- None (Any disturbances and when (visitation, over night)- None (Concerns raised by the patient)- None (SI/HI/AVH)- Denies

## 2024-08-27 NOTE — Plan of Care (Signed)
  Problem: Activity: Goal: Interest or engagement in activities will improve Outcome: Progressing Goal: Sleeping patterns will improve Outcome: Progressing   Problem: Coping: Goal: Ability to verbalize frustrations and anger appropriately will improve Outcome: Progressing Goal: Ability to demonstrate self-control will improve Outcome: Progressing   Problem: Safety: Goal: Periods of time without injury will increase Outcome: Progressing

## 2024-08-27 NOTE — Plan of Care (Signed)
   Problem: Education: Goal: Knowledge of [place] [facility] Education information/materials will improve Outcome: Progressing Goal: Emotional status will improve Outcome: Progressing Goal: Mental status will improve Outcome: Progressing Goal: Verbalization of understanding the information provided will improve Outcome: Progressing

## 2024-08-28 NOTE — Group Note (Signed)
 Date:  08/28/2024 Time:  9:09 AM  Group Topic/Focus:  Goals Group:   The focus of this group is to help patients establish daily goals to achieve during treatment and discuss how the patient can incorporate goal setting into their daily lives to aide in recovery. Orientation:   The focus of this group is to educate the patient on the purpose and policies of crisis stabilization and provide a format to answer questions about their admission.  The group details unit policies and expectations of patients while admitted.    Participation Level:  Active  Participation Quality:  Appropriate  Affect:  Appropriate  Cognitive:  Appropriate  Insight: Appropriate  Engagement in Group:  Engaged  Modes of Intervention:  Orientation  Additional Comments:    Rankin JONETTA Sierra 08/28/2024, 9:09 AM

## 2024-08-28 NOTE — Progress Notes (Signed)
Pt did not attend wrap-up group   

## 2024-08-28 NOTE — Plan of Care (Signed)
  Problem: Activity: Goal: Interest or engagement in activities will improve Outcome: Progressing Goal: Sleeping patterns will improve Outcome: Progressing   Problem: Coping: Goal: Ability to verbalize frustrations and anger appropriately will improve Outcome: Progressing Goal: Ability to demonstrate self-control will improve Outcome: Progressing   Problem: Safety: Goal: Periods of time without injury will increase Outcome: Progressing
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
## 2024-08-28 NOTE — Group Note (Signed)
 [facility] LCSW Group Therapy Note  08/28/2024  10:00-11:00AM  Type of Therapy and Topic:  Group Therapy:  Building Supports and Asking for Help  Participation Level:  Active   Description of Group:  Patients in this group wished to discuss whether it is a weakness to ask for help.  Processing of this question and related matters took place while group facilitator ensured that all patients had an opportunity to speak, responses were on topic, and the direction of the discussion was positive in nature.    Therapeutic Goals: 1)  discuss why asking for help can be a strength rather than a weakness  2)  explore individual patients' experience in asking for assistance  3)  encourage mutual support among group members  4)  demonstrate the importance of adding supports and of setting boundaries   Summary of Patient Progress:  The patient expressed full comprehension of the concepts presented.  She reported that her doctor is a big source of support to her and she spoke up in group to express her feelings and thoughts.  Therapeutic Modalities:   Processing Demonstration  Elgie JINNY Crest, LCSW 08/28/2024 1:15 PM

## 2024-08-28 NOTE — Group Note (Signed)
 Date:  08/28/2024 Time:  7:02 PM  Group Topic/Focus:  Making Healthy Choices:   The focus of this group is to help patients identify negative/unhealthy choices they were using prior to admission and identify positive/healthier coping strategies to replace them upon discharge. Self Care:   The focus of this group is to help patients understand the importance of self-care in order to improve or restore emotional, physical, spiritual, interpersonal, and financial health. Gut health: Teach about the microbiome of our intestines and how it impacts our physical and mental health. How to help it help us .    Participation Level:  Did Not Attend  Sheryl Brown 08/28/2024, 7:02 PM

## 2024-08-28 NOTE — Progress Notes (Signed)
   08/28/24 0930  Psych Admission Type (Psych Patients Only)  Admission Status Voluntary  Psychosocial Assessment  Patient Complaints None  Eye Contact Fair  Facial Expression Animated  Affect Appropriate to circumstance  Speech Logical/coherent  Interaction Assertive  Motor Activity Other (Comment) (wnl)  Appearance/Hygiene Improved  Behavior Characteristics Appropriate to situation;Cooperative  Mood Anxious  Thought Process  Coherency WDL  Content WDL  Delusions None reported or observed  Perception WDL  Hallucination None reported or observed  Judgment Poor  Confusion None  Danger to Self  Current suicidal ideation? Denies  Danger to Others  Danger to Others None reported or observed

## 2024-08-29 DIAGNOSIS — F332 Major depressive disorder, recurrent severe without psychotic features: Principal | ICD-10-CM

## 2024-08-29 DIAGNOSIS — R4189 Other symptoms and signs involving cognitive functions and awareness: Secondary | ICD-10-CM

## 2024-08-29 DIAGNOSIS — F1020 Alcohol dependence, uncomplicated: Secondary | ICD-10-CM

## 2024-08-29 MED ORDER — AMLODIPINE BESYLATE 5 MG PO TABS: 5.0000 mg | ORAL_TABLET | Freq: Every day | ORAL | Status: AC

## 2024-08-29 MED ORDER — VITAMIN D3 25 MCG PO TABS: 2000.0000 [IU] | ORAL_TABLET | Freq: Every day | ORAL | Status: AC

## 2024-08-29 MED ORDER — LAMOTRIGINE 150 MG PO TABS: 150.0000 mg | ORAL_TABLET | Freq: Every day | ORAL | 0 refills | Status: AC

## 2024-08-29 MED ORDER — NICOTINE 14 MG/24HR TD PT24: 14.0000 mg | MEDICATED_PATCH | Freq: Every day | TRANSDERMAL | 0 refills | Status: AC

## 2024-08-29 MED ORDER — FLUOXETINE HCL 20 MG PO CAPS
20.0000 mg | ORAL_CAPSULE | Freq: Every day | ORAL | 0 refills | Status: AC
Start: 2024-08-29 — End: ?

## 2024-08-29 NOTE — Progress Notes (Signed)
   08/28/24 2200  Psych Admission Type (Psych Patients Only)  Admission Status Voluntary  Psychosocial Assessment  Patient Complaints None  Eye Contact Fair  Facial Expression Animated  Affect Appropriate to circumstance  Speech Logical/coherent  Interaction Assertive  Motor Activity Other (Comment) (WDL)  Appearance/Hygiene Unremarkable  Behavior Characteristics Cooperative  Mood Anxious  Thought Process  Coherency WDL  Content WDL  Delusions None reported or observed  Perception WDL  Hallucination None reported or observed  Judgment Poor  Confusion None  Danger to Self  Current suicidal ideation? Denies  Agreement Not to Harm Self Yes  Description of Agreement Verbal  Danger to Others  Danger to Others None reported or observed

## 2024-08-29 NOTE — Progress Notes (Signed)
  [facility] Adult Case Management Discharge Plan :  Will you be returning to the same living situation after discharge:  Yes,  patient is returning back to address on file. At discharge, do you have transportation home?: Yes,  patient's granddaughter, Anabel Washington , is providing transportation at 10 am.    Do you have the ability to pay for your medications: Yes,  patient has active health insurance.  Release of information consent forms completed and in the chart;  Patient's signature needed at discharge.  Patient to Follow up at:  Follow-up Information     [facility] Follow up on 09/03/2024.   Why: You have a hospital follow up appointment, to obtain therapy and medication management services on 09/03/24 at 11:00 am.  The appointment will be Virtual, telehealth. Contact information: [address]  [address] [place] [address] [phone]                 Next level of care provider has access to [facility] Link:no  Safety Planning and Suicide Prevention discussed: Yes,  completed with Anabel Washington  (granddaughter) [phone].     Has patient been referred to the Quitline?: Patient refused referral for treatment  Patient has been referred for addiction treatment: Patient refused referral for treatment.  Louetta Lame, LCSWA 08/29/2024, 8:52 AM

## 2024-08-29 NOTE — Group Note (Signed)
 Date:  08/29/2024 Time:  9:56 AM  Group Topic/Focus:  Goals Group:   The focus of this group is to help patients establish daily goals to achieve during treatment and discuss how the patient can incorporate goal setting into their daily lives to aide in recovery. Orientation:   The focus of this group is to educate the patient on the purpose and policies of crisis stabilization and provide a format to answer questions about their admission.  The group details unit policies and expectations of patients while admitted.    Participation Level:  Did Not Attend   Sheryl Brown 08/29/2024, 9:56 AM

## 2024-08-29 NOTE — Progress Notes (Signed)
(  Sleep Hours) - 9.75 (Any PRNs that were needed, meds refused, or side effects to meds)- No PRN meds given, no meds refused.  (Any disturbances and when (visitation, over night)- None  (Concerns raised by the patient)- None  (SI/HI/AVH)- Denies SI/HI/AVH.

## 2024-08-29 NOTE — Progress Notes (Signed)
   08/29/24 0800  Psych Admission Type (Psych Patients Only)  Admission Status Voluntary  Psychosocial Assessment  Patient Complaints None  Eye Contact Fair  Facial Expression Animated  Affect Appropriate to circumstance  Speech Logical/coherent  Interaction Assertive  Motor Activity Slow  Appearance/Hygiene Unremarkable  Behavior Characteristics Cooperative  Mood Anxious  Thought Process  Coherency WDL  Content WDL  Delusions None reported or observed  Perception WDL  Hallucination None reported or observed  Judgment Poor  Confusion None  Danger to Self  Current suicidal ideation? Denies  Description of Suicide Plan No Plan  Agreement Not to Harm Self Yes  Description of Agreement Verbal  Danger to Others  Danger to Others None reported or observed

## 2024-08-29 NOTE — Progress Notes (Signed)
 Pt discharged to lobby. Pt was stable and appreciative at that time. All papers and prescriptions were given and valuables returned. Verbal understanding expressed. Denies SI/HI and A/VH. Pt given opportunity to express concerns and ask questions.

## 2024-08-29 NOTE — Plan of Care (Signed)
   Problem: Education: Goal: Emotional status will improve Outcome: Progressing Goal: Mental status will improve Outcome: Progressing Goal: Verbalization of understanding the information provided will improve Outcome: Progressing   Problem: Activity: Goal: Interest or engagement in activities will improve Outcome: Progressing

## 2024-08-29 NOTE — [facility] Suicide Risk Assessment (Signed)
 [facility] Discharge Suicide Risk Assessment   Suicide Risk Assessment: The patient presented with acute risk factors for suicide including alcohol use, suicidal gesture (taking additional pills) and active depression. She additionally carries chronic risk factors including chronic pain, psychosocial stressors (loss of best friend with anniversary upcoming), history of prior suicide attempts (one prior OD in her 30s). To address acute risk factors the patient was admitted to inpatient psychiatry and review of medications was done and changes made. She was additionally exposed to groups and daily psychiatric management with improvements in mood, sleep and affect. Today euthymic and engaged, expressing future orientation. Additional protective factors include female gender, domiciled, in a relationship, family support (siblings, children and grandchildren), denial of SI since second day of admission. Current and short term risk of suicide is considered low. Outpatient follow up is the most appropriate and least restrictive environment.     Follow-up Information     [facility] Follow up on 09/03/2024.   Why: You have a hospital follow up appointment, to obtain therapy and medication management services on 09/03/24 at 11:00 am.  The appointment will be Virtual, telehealth. Contact information: [address]  [address] [place] [address] [phone]                  Leita LOISE Arts, MD 08/29/2024, 7:50 AM

## 2024-08-29 NOTE — Plan of Care (Signed)
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
## 2024-10-09 ENCOUNTER — Other Ambulatory Visit (HOSPITAL_BASED_OUTPATIENT_CLINIC_OR_DEPARTMENT_OTHER): Payer: Self-pay

## 2024-10-09 ENCOUNTER — Other Ambulatory Visit: Payer: Self-pay

## 2024-10-09 ENCOUNTER — Encounter (HOSPITAL_BASED_OUTPATIENT_CLINIC_OR_DEPARTMENT_OTHER): Payer: Self-pay | Admitting: Emergency Medicine

## 2024-10-09 ENCOUNTER — Emergency Department (HOSPITAL_BASED_OUTPATIENT_CLINIC_OR_DEPARTMENT_OTHER)
Admission: EM | Admit: 2024-10-09 | Discharge: 2024-10-09 | Disposition: A | Discharge status: Home / Self Care | Attending: Emergency Medicine | Admitting: Emergency Medicine

## 2024-10-09 DIAGNOSIS — R220 Localized swelling, mass and lump, head: Secondary | ICD-10-CM | POA: Diagnosis present

## 2024-10-09 DIAGNOSIS — K1120 Sialoadenitis, unspecified: Secondary | ICD-10-CM | POA: Diagnosis not present

## 2024-10-09 HISTORY — DX: Pure hypercholesterolemia, unspecified: E78.00

## 2024-10-09 HISTORY — DX: Essential (primary) hypertension: I10

## 2024-10-09 MED ORDER — AMOXICILLIN-POT CLAVULANATE 875-125 MG PO TABS
1.0000 | ORAL_TABLET | Freq: Two times a day (BID) | ORAL | 0 refills | Status: DC
  Filled 2024-10-09: qty 14, 7d supply, fill #0

## 2024-10-09 NOTE — ED Triage Notes (Signed)
 Pt endorses concern for insect bite to LT jaw yesterday. Swelling noted to LT cheek. denies shob. Also reports nausea and generalized weakness, concern for UTI. Also reports body goes weak and numb x 2 days after sitting too long. Speech clear, bilaterally equal and ambulatory with steady gait.

## 2024-10-09 NOTE — Discharge Instructions (Signed)
 You were seen for swelling of your jaw in the emergency department.  You likely have an infected salivary duct.  At home, please try to eat sour foods or sour candy several times a day to help increase salivation.  Take the antibiotics (Augmentin ) we have prescribed you.    Check your MyChart online for the results of any tests that had not resulted by the time you left the emergency department.   Follow-up with your primary doctor in 2-3 days regarding your visit.  Talk to them about following up with an ear nose and throat doctor if your symptoms persist.  Follow-up with the spine team about your sciatica.  Return immediately to the emergency department if you experience any of the following: Fevers, difficulty speaking or swallowing, difficulty breathing, or any other concerning symptoms.    Thank you for visiting our Emergency Department. It was a pleasure taking care of you today.

## 2024-10-09 NOTE — ED Provider Notes (Signed)
 [place] EMERGENCY DEPARTMENT AT [facility] Provider Note   CSN: [id number] Arrival date & time: 10/09/24  9090     Patient presents with: Multiple Complaints   Sheryl Brown is a 63 y.o. female.   63 year old female who presents to the emergency department with jaw swelling.  Patient reports that this morning she woke up and noticed that the left side of her jaw was swollen.  Also painful.  Says that she thought that she might have been bit by an insect but does not actually recall an insect biting her.  No dental pain.  No strange taste in her mouth.  Has not yet tried to eat anything today.  No fevers or chills.  No difficulty speaking or swallowing.       Prior to Admission medications   Medication Sig Start Date End Date Taking? Authorizing Provider  amoxicillin -clavulanate (AUGMENTIN ) 875-125 MG tablet Take 1 tablet by mouth every 12 (twelve) hours. 10/09/24  Yes Yolande Lamar BROCKS, MD  amLODipine  (NORVASC ) 5 MG tablet Take 1 tablet (5 mg total) by mouth daily. 08/29/24   Towana Leita SAILOR, MD  cholecalciferol  (CHOLECALCIFEROL ) 25 MCG tablet Take 2 tablets (2,000 Units total) by mouth daily. 08/29/24   Towana Leita SAILOR, MD  FLUoxetine  (PROZAC ) 20 MG capsule Take 1 capsule (20 mg total) by mouth daily. 08/29/24   Towana Leita SAILOR, MD  lamoTRIgine  (LAMICTAL ) 150 MG tablet Take 1 tablet (150 mg total) by mouth daily. 08/29/24 09/28/24  Towana Leita SAILOR, MD  lubiprostone  (AMITIZA ) 24 MCG capsule Take 24 mcg by mouth daily.    [provider]  meclizine  (ANTIVERT ) 25 MG tablet Take 1 tablet (25 mg total) by mouth 3 (three) times daily as needed for dizziness. 03/31/24   Amin, Ankit C, MD  nicotine  (NICODERM CQ  - DOSED IN MG/24 HOURS) 14 mg/24hr patch Place 1 patch (14 mg total) onto the skin daily. 08/29/24   Towana Leita SAILOR, MD  ondansetron  (ZOFRAN -ODT) 4 MG disintegrating tablet Take 1 tablet (4 mg total) by mouth every 8 (eight) hours as needed for nausea or  vomiting. 05/29/23   Francesca Elsie CROME, MD  rosuvastatin  (CRESTOR ) 20 MG tablet Take 20 mg by mouth at bedtime. 02/25/24   [provider]  SUMAtriptan  (IMITREX ) 50 MG tablet Take 1 tablet (50 mg total) by mouth every 2 (two) hours as needed. May repeat in 2 hours if headache persists or recurs. 06/23/24   Camara, Amadou, MD  Vonoprazan Fumarate (VOQUEZNA) 20 MG TABS Take 20 mg by mouth daily. 08/18/24   [provider]    Allergies: Patient has no known allergies.    Review of Systems  Updated Vital Signs BP (!) 156/95   Pulse 88   Temp 98.1 F (36.7 C) (Oral)   Resp 16   Ht 5' 6 (1.676 m)   Wt 80 kg   SpO2 100%   BMI 28.47 kg/m   Physical Exam Constitutional:      Appearance: Normal appearance.  HENT:     Right Ear: External ear normal.     Left Ear: External ear normal.     Mouth/Throat:     Mouth: Mucous membranes are moist.     Pharynx: Oropharynx is clear.     Comments: Uvula: without deviation or edema. Uvula midline. Soft palate: without swelling Sublingual: normal appearance/no brawny edema or tongue elevation Teeth and gums: No periapical swelling or fluctuance, no tooth fracture, no gingival swelling, no active oral bleeding. No  TTP of teeth.  Tonsils: no erythema or exudates Submandibular swelling and tenderness palpation along the left side of the mandible.  No overlying erythema or warmth Neurological:     Mental Status: She is alert.     (all labs ordered are listed, but only abnormal results are displayed) Labs Reviewed - No data to display  EKG: None  Radiology: No results found.   Procedures   Medications Ordered in the ED - No data to display                                  Medical Decision Making Risk Prescription drug management.   Sheryl Brown is a 63 year old female who presents to the emergency department with left jaw swelling and pain  Initial Ddx:  Dental infection, abscess, sialolithiasis,  sialadenitis, malignancy, Ludwig's angina  MDM/Course:  Patient presents to the emergency department for atraumatic swelling of her left jaw.  Also reports some pain there.  Not having any dental pain.  On exam does have some swelling around where her submandibular gland is on the left.  I do not appreciate any stones in the mouth.  No dental tenderness to palpation.  No fluctuance inside the mouth on palpation.  No signs of airway compromise this point time.  I suspect that she likely has sialoadenitis.  Low suspicion for a bug bite since I do not see any changes on the outside of the skin that would appear consistent with this.  Will have her follow-up with her primary doctor in several days and ENT if her symptoms persist.  She also has sciatica and is requesting information for a spine doctor to follow-up with and this was given to her as well.  Will have her eat sour food/candy and take Augmentin  for her sialadenitis  This patient presents to the ED for concern of complaints listed in HPI, this involves an extensive number of treatment options, and is a complaint that carries with it a high risk of complications and morbidity. Disposition including potential need for admission considered.   Dispo: DC Home. Return precautions discussed including, but not limited to, those listed in the AVS. Allowed pt time to ask questions which were answered fully prior to dc.  Records reviewed Outpatient Clinic Notes I have reviewed the patients home medications and made adjustments as needed  Portions of this note were generated with Dragon dictation software. Dictation errors may occur despite best attempts at proofreading.     Final diagnoses:  Sialoadenitis of submandibular gland    ED Discharge Orders          Ordered    amoxicillin -clavulanate (AUGMENTIN ) 875-125 MG tablet  Every 12 hours        10/09/24 1000               Yolande Lamar BROCKS, MD 10/09/24 1004

## 2024-11-23 ENCOUNTER — Ambulatory Visit (HOSPITAL_COMMUNITY)
Admission: EM | Admit: 2024-11-23 | Discharge: 2024-11-23 | Disposition: A | Discharge status: Home / Self Care | Attending: Family Medicine | Admitting: Family Medicine

## 2024-11-23 ENCOUNTER — Encounter (HOSPITAL_COMMUNITY): Payer: Self-pay | Admitting: Emergency Medicine

## 2024-11-23 DIAGNOSIS — K047 Periapical abscess without sinus: Secondary | ICD-10-CM | POA: Diagnosis not present

## 2024-11-23 MED ORDER — KETOROLAC TROMETHAMINE 10 MG PO TABS: 10.0000 mg | ORAL_TABLET | Freq: Four times a day (QID) | ORAL | 0 refills | Status: AC | PRN

## 2024-11-23 MED ORDER — KETOROLAC TROMETHAMINE 30 MG/ML IJ SOLN
INTRAMUSCULAR | Status: AC
  Filled 2024-11-23: qty 1

## 2024-11-23 MED ORDER — KETOROLAC TROMETHAMINE 30 MG/ML IJ SOLN
30.0000 mg | Freq: Once | INTRAMUSCULAR | Status: AC
  Administered 2024-11-23: 30 mg via INTRAMUSCULAR

## 2024-11-23 MED ORDER — AMOXICILLIN-POT CLAVULANATE 875-125 MG PO TABS: 1.0000 | ORAL_TABLET | Freq: Two times a day (BID) | ORAL | 0 refills | Status: AC

## 2024-11-23 NOTE — ED Provider Notes (Signed)
 " [facility]    CSN: [id number] Arrival date & time: 11/23/24  1044      History   Chief Complaint Chief Complaint  Patient presents with   Dental Pain    HPI DOREE KUEHNE is a 64 y.o. female.    Dental Pain  Here for pain around her right upper teeth and swelling of her right face.  The symptoms began about 2 days ago.  No fever currently.  About 10 days ago she had flulike symptoms with fever and weakness and cough and congestion.  Although it started improving after about 3 days of illness, but she still has some cough.  Not much mucus now.  NKDA  She has had a hysterectomy  Last eGFR was greater than 60 in October 2025 Past Medical History:  Diagnosis Date   GERD (gastroesophageal reflux disease)    High cholesterol    Hypertension    Meningitis 2013   Neuropathy    Seasonal allergies    Seizures (HCC)    Stroke [facility])     Patient Active Problem List   Diagnosis Date Noted   Moderate alcohol use disorder (HCC) 08/25/2024   Cognitive deficits 08/25/2024   Major depressive disorder, recurrent severe without psychotic features (HCC) 08/24/2024   GAD (generalized anxiety disorder) 08/24/2024   Class 2 obesity 03/28/2024   OSA (obstructive sleep apnea) 03/28/2024   Hypertensive crisis 03/27/2024   Severe obesity (BMI 35.0-39.9) with comorbidity (HCC) 11/22/2022   Adhesive capsulitis of right shoulder 10/26/2021   History of CVA (cerebrovascular accident) 10/03/2021   Vitamin D  deficiency 09/01/2019   Seizure (HCC) 01/18/2019   TIA (transient ischemic attack) 01/18/2019   Foot arthropathy 04/07/2018   HTN (hypertension) 01/28/2018   Vertigo 01/28/2018   Hyperlipidemia 06/03/2017   Chronic bilateral low back pain without sciatica 12/12/2016   Chronic sinusitis 12/12/2016   Personal history of nicotine  dependence 12/12/2016   URI 01/20/2009   TOBACCO ABUSE 12/08/2008   CANDIDIASIS, VAGINAL 10/21/2008   DEPRESSION, SITUATIONAL 05/31/2008    DENTAL PAIN 02/23/2008   ACUTE GINGIVITIS NONPLAQUE INDUCED 12/24/2007   INGUINAL PAIN, RIGHT 12/24/2007   UTI 11/26/2007   ROTATOR CUFF SYNDROME, RIGHT 07/24/2007   GERD 07/23/2007   ELEVATED BLOOD PRESSURE WITHOUT DIAGNOSIS OF HYPERTENSION 05/28/2007   SYMPTOM, PAIN, ABDOMINAL, GENERALIZED 11/25/2006   LABYRINTHITIS, HX OF 09/11/2006    Past Surgical History:  Procedure Laterality Date   CESAREAN SECTION      OB History   No obstetric history on file.      Home Medications    Prior to Admission medications  Medication Sig Start Date End Date Taking? Authorizing Provider  amitriptyline  (ELAVIL ) 100 MG tablet Take 100 mg by mouth at bedtime. 09/30/24  Yes [provider]  amoxicillin -clavulanate (AUGMENTIN ) 875-125 MG tablet Take 1 tablet by mouth 2 (two) times daily for 7 days. 11/23/24 11/30/24 Yes BanisterSharlet POUR, MD  hydrOXYzine  (VISTARIL ) 25 MG capsule Take 25-50 mg by mouth at bedtime as needed. 10/22/24  Yes [provider]  ketorolac  (TORADOL ) 10 MG tablet Take 1 tablet (10 mg total) by mouth every 6 (six) hours as needed (pain). 11/23/24  Yes Vonna Sharlet POUR, MD  LINZESS  145 MCG CAPS capsule Take 145 mcg by mouth. 10/05/24  Yes [provider]  amLODipine  (NORVASC ) 5 MG tablet Take 1 tablet (5 mg total) by mouth daily. 08/29/24   Towana Leita SAILOR, MD  cholecalciferol  (CHOLECALCIFEROL ) 25 MCG tablet Take 2 tablets (2,000 Units  total) by mouth daily. 08/29/24   Towana Leita SAILOR, MD  FLUoxetine  (PROZAC ) 20 MG capsule Take 1 capsule (20 mg total) by mouth daily. 08/29/24   Towana Leita SAILOR, MD  lamoTRIgine  (LAMICTAL ) 150 MG tablet Take 1 tablet (150 mg total) by mouth daily. 08/29/24 09/28/24  Towana Leita SAILOR, MD  lubiprostone  (AMITIZA ) 24 MCG capsule Take 24 mcg by mouth daily.    [provider]  nicotine  (NICODERM CQ  - DOSED IN MG/24 HOURS) 14 mg/24hr patch Place 1 patch (14 mg total) onto the skin daily. 08/29/24   Towana Leita SAILOR, MD   rosuvastatin  (CRESTOR ) 20 MG tablet Take 20 mg by mouth at bedtime. 02/25/24   [provider]  SUMAtriptan  (IMITREX ) 50 MG tablet Take 1 tablet (50 mg total) by mouth every 2 (two) hours as needed. May repeat in 2 hours if headache persists or recurs. 06/23/24   Camara, Amadou, MD  Vonoprazan Fumarate (VOQUEZNA) 20 MG TABS Take 20 mg by mouth daily. 08/18/24   [provider]    Family History Family History  Problem Relation Age of Onset   Heart failure Mother    Cirrhosis Father     Social History Social History[1]   Allergies   Patient has no known allergies.   Review of Systems Review of Systems   Physical Exam Triage Vital Signs ED Triage Vitals  Encounter Vitals Group     BP 11/23/24 1214 132/87     Girls Systolic BP Percentile --      Girls Diastolic BP Percentile --      Boys Systolic BP Percentile --      Boys Diastolic BP Percentile --      Pulse Rate 11/23/24 1214 83     Resp 11/23/24 1214 17     Temp 11/23/24 1214 98.4 F (36.9 C)     Temp Source 11/23/24 1214 Oral     SpO2 11/23/24 1214 95 %     Weight --      Height --      Head Circumference --      Peak Flow --      Pain Score 11/23/24 1212 9     Pain Loc --      Pain Education --      Exclude from Growth Chart --    No data found.  Updated Vital Signs BP 132/87 (BP Location: Left Arm)   Pulse 83   Temp 98.4 F (36.9 C) (Oral)   Resp 17   SpO2 95%   Visual Acuity Right Eye Distance:   Left Eye Distance:   Bilateral Distance:    Right Eye Near:   Left Eye Near:    Bilateral Near:     Physical Exam Constitutional:      General: She is not in acute distress.    Appearance: She is not ill-appearing, toxic-appearing or diaphoretic.  HENT:     Right Ear: Tympanic membrane and ear canal normal.     Left Ear: Tympanic membrane and ear canal normal.     Nose: Nose normal.     Mouth/Throat:     Mouth: Mucous membranes are moist.     Pharynx: No oropharyngeal exudate  or posterior oropharyngeal erythema.     Comments: There is some swelling of the right lateral upper dental ridge.  No fluctuance.  There is also some swelling of the right exterior upper cheek.  No erythema inside the mouth or externally Eyes:  Extraocular Movements: Extraocular movements intact.     Conjunctiva/sclera: Conjunctivae normal.     Pupils: Pupils are equal, round, and reactive to light.  Cardiovascular:     Rate and Rhythm: Normal rate and regular rhythm.     Heart sounds: No murmur heard. Pulmonary:     Effort: Pulmonary effort is normal. No respiratory distress.     Breath sounds: No stridor. No wheezing, rhonchi or rales.  Musculoskeletal:     Cervical back: Neck supple.  Lymphadenopathy:     Cervical: No cervical adenopathy.  Skin:    Capillary Refill: Capillary refill takes less than 2 seconds.     Coloration: Skin is not jaundiced or pale.  Neurological:     General: No focal deficit present.     Mental Status: She is alert and oriented to person, place, and time.  Psychiatric:        Behavior: Behavior normal.      UC Treatments / Results  Labs (all labs ordered are listed, but only abnormal results are displayed) Labs Reviewed - No data to display  EKG   Radiology No results found.  Procedures Procedures (including critical care time)  Medications Ordered in UC Medications  ketorolac  (TORADOL ) 30 MG/ML injection 30 mg (has no administration in time range)    Initial Impression / Assessment and Plan / UC Course  I have reviewed the triage vital signs and the nursing notes.  Pertinent labs & imaging results that were available during my care of the patient were reviewed by me and considered in my medical decision making (see chart for details).     Augmentin  is sent in for the dental infection.  Toradol  injection is given here and then Toradol  tablets are sent to the pharmacy Final Clinical Impressions(s) / UC Diagnoses   Final  diagnoses:  Dental infection     Discharge Instructions      You have been given a shot of Toradol  30 mg today.  Ketorolac  10 mg tablets--take 1 tablet every 6 hours as needed for pain.  This is the same medicine that is in the shot we just gave you  Take amoxicillin -clavulanate 875 mg--1 tab twice daily with food for 7 days  He will be good to establish with a dentist soon.  Please call your dental insurance for a list of participating providers.     ED Prescriptions     Medication Sig Dispense Auth. Provider   amoxicillin -clavulanate (AUGMENTIN ) 875-125 MG tablet Take 1 tablet by mouth 2 (two) times daily for 7 days. 14 tablet Zenab Gronewold K, MD   ketorolac  (TORADOL ) 10 MG tablet Take 1 tablet (10 mg total) by mouth every 6 (six) hours as needed (pain). 20 tablet Audi Conover K, MD      PDMP not reviewed this encounter.    [1]  Social History Tobacco Use   Smoking status: Every Day    Current packs/day: 0.25    Types: Cigarettes   Smokeless tobacco: Never  Vaping Use   Vaping status: Never Used  Substance Use Topics   Alcohol use: Yes    Comment: only on holidays   Drug use: Never     Vonna Sharlet POUR, MD 11/23/24 1255  "

## 2024-11-23 NOTE — Discharge Instructions (Addendum)
 You have been given a shot of Toradol  30 mg today.  Ketorolac  10 mg tablets--take 1 tablet every 6 hours as needed for pain.  This is the same medicine that is in the shot we just gave you  Take amoxicillin -clavulanate 875 mg--1 tab twice daily with food for 7 days  He will be good to establish with a dentist soon.  Please call your dental insurance for a list of participating providers.

## 2024-11-23 NOTE — ED Triage Notes (Signed)
 Pt had right dental pain for a couple days. Reports woke up with right upper gum swelling. Taking tylenol .

## 2025-02-24 ENCOUNTER — Ambulatory Visit: Admitting: Neurology
